# Patient Record
Sex: Male | Born: 1965 | Race: White | Hispanic: No | Marital: Single | Smoking: Current every day smoker
Health system: Southern US, Community
[De-identification: ages and names within clinical notes are randomized; demographics above are authoritative.]

## PROBLEM LIST (undated history)

## (undated) DIAGNOSIS — F411 Generalized anxiety disorder: Secondary | ICD-10-CM

## (undated) DIAGNOSIS — K219 Gastro-esophageal reflux disease without esophagitis: Secondary | ICD-10-CM

## (undated) DIAGNOSIS — F32A Depression, unspecified: Secondary | ICD-10-CM

## (undated) DIAGNOSIS — F4310 Post-traumatic stress disorder, unspecified: Secondary | ICD-10-CM

## (undated) DIAGNOSIS — J449 Chronic obstructive pulmonary disease, unspecified: Secondary | ICD-10-CM

## (undated) DIAGNOSIS — F1010 Alcohol abuse, uncomplicated: Secondary | ICD-10-CM

## (undated) DIAGNOSIS — F329 Major depressive disorder, single episode, unspecified: Secondary | ICD-10-CM

## (undated) DIAGNOSIS — I10 Essential (primary) hypertension: Secondary | ICD-10-CM

## (undated) DIAGNOSIS — T148XXA Other injury of unspecified body region, initial encounter: Secondary | ICD-10-CM

## (undated) DIAGNOSIS — F419 Anxiety disorder, unspecified: Secondary | ICD-10-CM

## (undated) HISTORY — PX: ABDOMINAL EXPLORATION SURGERY: SHX538

## (undated) HISTORY — PX: KNEE SURGERY: SHX244

---
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
## 2018-05-10 DIAGNOSIS — J449 Chronic obstructive pulmonary disease, unspecified: Secondary | ICD-10-CM | POA: Insufficient documentation

## 2018-05-10 DIAGNOSIS — F1020 Alcohol dependence, uncomplicated: Secondary | ICD-10-CM | POA: Insufficient documentation

## 2018-05-10 DIAGNOSIS — I10 Essential (primary) hypertension: Secondary | ICD-10-CM | POA: Insufficient documentation

## 2018-05-10 DIAGNOSIS — E039 Hypothyroidism, unspecified: Secondary | ICD-10-CM | POA: Insufficient documentation

## 2018-05-10 DIAGNOSIS — F419 Anxiety disorder, unspecified: Secondary | ICD-10-CM | POA: Insufficient documentation

## 2018-05-10 DIAGNOSIS — N401 Enlarged prostate with lower urinary tract symptoms: Secondary | ICD-10-CM | POA: Insufficient documentation

## 2018-05-10 DIAGNOSIS — K219 Gastro-esophageal reflux disease without esophagitis: Secondary | ICD-10-CM | POA: Insufficient documentation

## 2018-05-10 DIAGNOSIS — G894 Chronic pain syndrome: Secondary | ICD-10-CM | POA: Insufficient documentation

## 2018-05-10 DIAGNOSIS — F1910 Other psychoactive substance abuse, uncomplicated: Secondary | ICD-10-CM | POA: Insufficient documentation

## 2018-05-10 DIAGNOSIS — Z5900 Homelessness unspecified: Secondary | ICD-10-CM | POA: Insufficient documentation

## 2018-05-10 DIAGNOSIS — F331 Major depressive disorder, recurrent, moderate: Secondary | ICD-10-CM | POA: Insufficient documentation

## 2018-05-15 DIAGNOSIS — F1020 Alcohol dependence, uncomplicated: Secondary | ICD-10-CM | POA: Insufficient documentation

## 2019-04-19 ENCOUNTER — Emergency Department (HOSPITAL_BASED_OUTPATIENT_CLINIC_OR_DEPARTMENT_OTHER): Payer: Medicare Other

## 2019-04-19 ENCOUNTER — Emergency Department (HOSPITAL_BASED_OUTPATIENT_CLINIC_OR_DEPARTMENT_OTHER)
Admission: EM | Admit: 2019-04-19 | Discharge: 2019-04-19 | Disposition: A | Discharge status: Home / Self Care | Payer: Medicare Other | Attending: Emergency Medicine | Admitting: Emergency Medicine

## 2019-04-19 ENCOUNTER — Other Ambulatory Visit: Payer: Self-pay

## 2019-04-19 ENCOUNTER — Encounter (HOSPITAL_BASED_OUTPATIENT_CLINIC_OR_DEPARTMENT_OTHER): Payer: Self-pay | Admitting: *Deleted

## 2019-04-19 DIAGNOSIS — J449 Chronic obstructive pulmonary disease, unspecified: Secondary | ICD-10-CM | POA: Insufficient documentation

## 2019-04-19 DIAGNOSIS — F17200 Nicotine dependence, unspecified, uncomplicated: Secondary | ICD-10-CM | POA: Diagnosis not present

## 2019-04-19 DIAGNOSIS — S60212A Contusion of left wrist, initial encounter: Secondary | ICD-10-CM

## 2019-04-19 DIAGNOSIS — S6992XA Unspecified injury of left wrist, hand and finger(s), initial encounter: Secondary | ICD-10-CM | POA: Diagnosis present

## 2019-04-19 DIAGNOSIS — I10 Essential (primary) hypertension: Secondary | ICD-10-CM | POA: Insufficient documentation

## 2019-04-19 DIAGNOSIS — Y9389 Activity, other specified: Secondary | ICD-10-CM | POA: Diagnosis not present

## 2019-04-19 DIAGNOSIS — Z79899 Other long term (current) drug therapy: Secondary | ICD-10-CM | POA: Insufficient documentation

## 2019-04-19 DIAGNOSIS — Y92810 Car as the place of occurrence of the external cause: Secondary | ICD-10-CM | POA: Insufficient documentation

## 2019-04-19 DIAGNOSIS — Y998 Other external cause status: Secondary | ICD-10-CM | POA: Diagnosis not present

## 2019-04-19 HISTORY — DX: Post-traumatic stress disorder, unspecified: F43.10

## 2019-04-19 HISTORY — DX: Other injury of unspecified body region, initial encounter: T14.8XXA

## 2019-04-19 HISTORY — DX: Essential (primary) hypertension: I10

## 2019-04-19 HISTORY — DX: Anxiety disorder, unspecified: F41.9

## 2019-04-19 HISTORY — DX: Chronic obstructive pulmonary disease, unspecified: J44.9

## 2019-04-19 HISTORY — DX: Depression, unspecified: F32.A

## 2019-04-19 MED ORDER — AMOXICILLIN-POT CLAVULANATE 875-125 MG PO TABS: 1.0000 | ORAL_TABLET | Freq: Two times a day (BID) | ORAL | 0 refills | Status: DC

## 2019-04-19 MED ORDER — AMOXICILLIN-POT CLAVULANATE 875-125 MG PO TABS
1.0000 | ORAL_TABLET | Freq: Once | ORAL | Status: AC
  Administered 2019-04-19: 1 via ORAL
  Filled 2019-04-19: qty 1

## 2019-04-19 MED ORDER — IBUPROFEN 800 MG PO TABS
800.0000 mg | ORAL_TABLET | Freq: Once | ORAL | Status: AC
  Administered 2019-04-19: 22:00:00 800 mg via ORAL
  Filled 2019-04-19: qty 1

## 2019-04-19 MED ORDER — LIDOCAINE HCL (PF) 1 % IJ SOLN
5.0000 mL | Freq: Once | INTRAMUSCULAR | Status: AC
  Administered 2019-04-19: 22:00:00 5 mL via INTRADERMAL
  Filled 2019-04-19: qty 5

## 2019-04-19 NOTE — ED Notes (Signed)
MD at bedside, suturing patient at this time.

## 2019-04-19 NOTE — Discharge Instructions (Signed)
Take augmentin twice daily for a week to prevent infection   Take tylenol, motrin for pain   See your doctor  You may remove the dressing tomorrow. The hematoma may return but there are no glass in the wound   Return to ER if you have worse wrist pain or swelling, fever, purulent discharge from wound
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
## 2019-08-08 DIAGNOSIS — F1120 Opioid dependence, uncomplicated: Secondary | ICD-10-CM | POA: Insufficient documentation

## 2020-04-29 ENCOUNTER — Emergency Department (HOSPITAL_COMMUNITY): Payer: Medicare Other

## 2020-04-29 ENCOUNTER — Other Ambulatory Visit: Payer: Self-pay

## 2020-04-29 ENCOUNTER — Emergency Department (HOSPITAL_COMMUNITY)
Admission: EM | Admit: 2020-04-29 | Discharge: 2020-04-30 | Disposition: A | Discharge status: Home / Self Care | Payer: Medicare Other | Attending: Emergency Medicine | Admitting: Emergency Medicine

## 2020-04-29 DIAGNOSIS — E860 Dehydration: Secondary | ICD-10-CM | POA: Diagnosis not present

## 2020-04-29 DIAGNOSIS — I10 Essential (primary) hypertension: Secondary | ICD-10-CM | POA: Diagnosis not present

## 2020-04-29 DIAGNOSIS — J449 Chronic obstructive pulmonary disease, unspecified: Secondary | ICD-10-CM | POA: Diagnosis not present

## 2020-04-29 DIAGNOSIS — F17200 Nicotine dependence, unspecified, uncomplicated: Secondary | ICD-10-CM | POA: Insufficient documentation

## 2020-04-29 DIAGNOSIS — Z79899 Other long term (current) drug therapy: Secondary | ICD-10-CM | POA: Diagnosis not present

## 2020-04-29 DIAGNOSIS — R112 Nausea with vomiting, unspecified: Secondary | ICD-10-CM | POA: Diagnosis not present

## 2020-04-29 DIAGNOSIS — R109 Unspecified abdominal pain: Secondary | ICD-10-CM | POA: Insufficient documentation

## 2020-04-29 MED ORDER — LORAZEPAM 2 MG/ML IJ SOLN
0.0000 mg | Freq: Four times a day (QID) | INTRAMUSCULAR | Status: DC
  Filled 2020-04-29: qty 1

## 2020-04-29 MED ORDER — THIAMINE HCL 100 MG PO TABS: 100.0000 mg | ORAL_TABLET | Freq: Every day | ORAL | Status: DC

## 2020-04-29 MED ORDER — SODIUM CHLORIDE 0.9 % IV BOLUS
1000.0000 mL | Freq: Once | INTRAVENOUS | Status: AC
  Administered 2020-04-30: 1000 mL via INTRAVENOUS

## 2020-04-29 MED ORDER — METOCLOPRAMIDE HCL 5 MG/ML IJ SOLN
10.0000 mg | Freq: Once | INTRAMUSCULAR | Status: AC
  Administered 2020-04-30: 10 mg via INTRAVENOUS
  Filled 2020-04-29: qty 2

## 2020-04-29 MED ORDER — LORAZEPAM 2 MG/ML IJ SOLN: 0.0000 mg | Freq: Two times a day (BID) | INTRAMUSCULAR | Status: DC

## 2020-04-29 MED ORDER — LORAZEPAM 1 MG PO TABS: 0.0000 mg | ORAL_TABLET | Freq: Two times a day (BID) | ORAL | Status: DC

## 2020-04-29 MED ORDER — DIPHENHYDRAMINE HCL 50 MG/ML IJ SOLN
25.0000 mg | Freq: Once | INTRAMUSCULAR | Status: AC
  Administered 2020-04-30: 25 mg via INTRAVENOUS
  Filled 2020-04-29: qty 1

## 2020-04-29 MED ORDER — THIAMINE HCL 100 MG/ML IJ SOLN
100.0000 mg | Freq: Every day | INTRAMUSCULAR | Status: DC
  Filled 2020-04-29: qty 2

## 2020-04-29 MED ORDER — LORAZEPAM 1 MG PO TABS: 0.0000 mg | ORAL_TABLET | Freq: Four times a day (QID) | ORAL | Status: DC

## 2020-04-29 NOTE — ED Triage Notes (Signed)
Pt arrived [facility] Was vomiting in room Possible detox Drank vodka earlier today  Complaining of abdominal pain
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
## 2020-04-30 LAB — COMPREHENSIVE METABOLIC PANEL
ALT: 21 U/L (ref 0–44)
AST: 22 U/L (ref 15–41)
Albumin: 4.9 g/dL (ref 3.5–5.0)
Alkaline Phosphatase: 84 U/L (ref 38–126)
Anion gap: 12 (ref 5–15)
BUN: 12 mg/dL (ref 6–20)
CO2: 24 mmol/L (ref 22–32)
Calcium: 9.3 mg/dL (ref 8.9–10.3)
Chloride: 100 mmol/L (ref 98–111)
Creatinine, Ser: 0.77 mg/dL (ref 0.61–1.24)
GFR calc Af Amer: >60 mL/min (ref >60)
GFR calc non Af Amer: >60 mL/min (ref >60)
Glucose, Bld: 122 mg/dL — ABNORMAL HIGH (ref 70–99)
Potassium: 3.6 mmol/L (ref 3.5–5.1)
Sodium: 136 mmol/L (ref 135–145)
Total Bilirubin: 0.9 mg/dL (ref 0.3–1.2)
Total Protein: 8.1 g/dL (ref 6.5–8.1)

## 2020-04-30 LAB — CBC
HCT: 41.3 % (ref 39.0–52.0)
Hemoglobin: 14.5 g/dL (ref 13.0–17.0)
MCH: 30.2 pg (ref 26.0–34.0)
MCHC: 35.1 g/dL (ref 30.0–36.0)
MCV: 86 fL (ref 80.0–100.0)
Platelets: 325 10*3/uL (ref 150–400)
RBC: 4.8 MIL/uL (ref 4.22–5.81)
RDW: 13.2 % (ref 11.5–15.5)
WBC: 9.2 10*3/uL (ref 4.0–10.5)
nRBC: 0 % (ref 0.0–0.2)

## 2020-04-30 LAB — ETHANOL: Alcohol, Ethyl (B): <10 mg/dL (ref <10)

## 2020-04-30 LAB — LACTIC ACID, PLASMA: Lactic Acid, Venous: 1.1 mmol/L (ref 0.5–1.9)

## 2020-04-30 LAB — LIPASE, BLOOD: Lipase: 37 U/L (ref 11–51)

## 2020-04-30 MED ORDER — ONDANSETRON HCL 4 MG PO TABS
4.0000 mg | ORAL_TABLET | Freq: Three times a day (TID) | ORAL | 0 refills | Status: DC | PRN
Start: 2020-04-30 — End: 2021-03-09

## 2020-04-30 NOTE — ED Notes (Signed)
Patient given 8oz of apple juice, tolerated without issue.

## 2020-04-30 NOTE — Discharge Instructions (Addendum)
Drink plenty of fluids (clear liquids) then start a bland diet later this morning such as toast, crackers, jello, [employer] chicken noodle soup. Use the zofran for nausea or vomiting. Recheck if you get worse.   

## 2020-06-17 DIAGNOSIS — F1520 Other stimulant dependence, uncomplicated: Secondary | ICD-10-CM | POA: Insufficient documentation

## 2020-06-17 DIAGNOSIS — F1220 Cannabis dependence, uncomplicated: Secondary | ICD-10-CM | POA: Insufficient documentation

## 2020-10-18 DIAGNOSIS — Z5181 Encounter for therapeutic drug level monitoring: Secondary | ICD-10-CM | POA: Insufficient documentation

## 2020-10-18 DIAGNOSIS — Z79891 Long term (current) use of opiate analgesic: Secondary | ICD-10-CM | POA: Insufficient documentation

## 2020-10-18 DIAGNOSIS — F1420 Cocaine dependence, uncomplicated: Secondary | ICD-10-CM | POA: Insufficient documentation

## 2021-03-06 ENCOUNTER — Other Ambulatory Visit: Payer: Self-pay

## 2021-03-06 ENCOUNTER — Emergency Department (HOSPITAL_COMMUNITY)
Admission: EM | Admit: 2021-03-06 | Discharge: 2021-03-07 | Disposition: A | Discharge status: Home / Self Care | Payer: Medicare HMO | Attending: Emergency Medicine | Admitting: Emergency Medicine

## 2021-03-06 DIAGNOSIS — S60812A Abrasion of left wrist, initial encounter: Secondary | ICD-10-CM | POA: Diagnosis not present

## 2021-03-06 DIAGNOSIS — I10 Essential (primary) hypertension: Secondary | ICD-10-CM | POA: Insufficient documentation

## 2021-03-06 DIAGNOSIS — S50311A Abrasion of right elbow, initial encounter: Secondary | ICD-10-CM | POA: Insufficient documentation

## 2021-03-06 DIAGNOSIS — F17200 Nicotine dependence, unspecified, uncomplicated: Secondary | ICD-10-CM | POA: Diagnosis not present

## 2021-03-06 DIAGNOSIS — S42401A Unspecified fracture of lower end of right humerus, initial encounter for closed fracture: Secondary | ICD-10-CM | POA: Insufficient documentation

## 2021-03-06 DIAGNOSIS — S42494A Other nondisplaced fracture of lower end of right humerus, initial encounter for closed fracture: Secondary | ICD-10-CM

## 2021-03-06 DIAGNOSIS — J449 Chronic obstructive pulmonary disease, unspecified: Secondary | ICD-10-CM | POA: Diagnosis not present

## 2021-03-06 DIAGNOSIS — Z23 Encounter for immunization: Secondary | ICD-10-CM | POA: Insufficient documentation

## 2021-03-06 DIAGNOSIS — Z79899 Other long term (current) drug therapy: Secondary | ICD-10-CM | POA: Insufficient documentation

## 2021-03-06 DIAGNOSIS — S4991XA Unspecified injury of right shoulder and upper arm, initial encounter: Secondary | ICD-10-CM | POA: Diagnosis present

## 2021-03-06 DIAGNOSIS — T07XXXA Unspecified multiple injuries, initial encounter: Secondary | ICD-10-CM
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
## 2021-03-07 ENCOUNTER — Emergency Department (HOSPITAL_COMMUNITY): Payer: Medicare HMO

## 2021-03-07 MED ORDER — HYDROCODONE-ACETAMINOPHEN 5-325 MG PO TABS
1.0000 | ORAL_TABLET | Freq: Once | ORAL | Status: AC
  Administered 2021-03-07: 1 via ORAL
  Filled 2021-03-07: qty 1

## 2021-03-07 MED ORDER — TETANUS-DIPHTH-ACELL PERTUSSIS 5-2.5-18.5 LF-MCG/0.5 IM SUSY
0.5000 mL | PREFILLED_SYRINGE | Freq: Once | INTRAMUSCULAR | Status: AC
  Administered 2021-03-07: 0.5 mL via INTRAMUSCULAR
  Filled 2021-03-07: qty 0.5

## 2021-03-07 NOTE — ED Notes (Signed)
Pt ambulated to x-ray.

## 2021-03-07 NOTE — Progress Notes (Signed)
Orthopedic Tech Progress Note Patient Details:  Andrew Wheeler 1966/05/26 [id number]  Ortho Devices Type of Ortho Device: Arm sling, Post (long arm) splint Ortho Device/Splint Location: rue Ortho Device/Splint Interventions: Ordered, Application, Adjustment   Post Interventions Patient Tolerated: Well Instructions Provided: Care of device, Adjustment of device  [name] 03/07/2021, 5:00 AM
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
## 2021-03-07 NOTE — ED Notes (Signed)
Ortho tech called 

## 2021-03-09 ENCOUNTER — Other Ambulatory Visit: Payer: Self-pay | Admitting: Critical Care Medicine

## 2021-03-09 ENCOUNTER — Encounter: Payer: Self-pay | Admitting: Critical Care Medicine

## 2021-03-09 DIAGNOSIS — S42401K Unspecified fracture of lower end of right humerus, subsequent encounter for fracture with nonunion: Secondary | ICD-10-CM

## 2021-03-09 MED ORDER — ADVAIR HFA 115-21 MCG/ACT IN AERO: 2.0000 | INHALATION_SPRAY | Freq: Two times a day (BID) | RESPIRATORY_TRACT | 12 refills | Status: DC

## 2021-03-09 MED ORDER — OMEPRAZOLE 40 MG PO CPDR: 40.0000 mg | DELAYED_RELEASE_CAPSULE | Freq: Every day | ORAL | 1 refills | Status: AC

## 2021-03-09 MED ORDER — HYDROCORTISONE 0.5 % EX CREA: 1.0000 "application " | TOPICAL_CREAM | Freq: Two times a day (BID) | CUTANEOUS | 0 refills | Status: DC

## 2021-03-09 MED ORDER — CLONIDINE HCL 0.1 MG PO TABS: 0.1000 mg | ORAL_TABLET | Freq: Two times a day (BID) | ORAL | 1 refills | Status: DC

## 2021-03-09 MED ORDER — HYDROXYZINE PAMOATE 25 MG PO CAPS
25.0000 mg | ORAL_CAPSULE | Freq: Three times a day (TID) | ORAL | 0 refills | Status: DC | PRN
Start: 2021-03-09 — End: 2021-03-25

## 2021-03-09 MED ORDER — ALBUTEROL SULFATE HFA 108 (90 BASE) MCG/ACT IN AERS: 2.0000 | INHALATION_SPRAY | Freq: Four times a day (QID) | RESPIRATORY_TRACT | 0 refills | Status: DC | PRN

## 2021-03-09 MED ORDER — PREGABALIN 150 MG PO CAPS: 150.0000 mg | ORAL_CAPSULE | Freq: Two times a day (BID) | ORAL | 1 refills | Status: DC

## 2021-03-09 MED ORDER — BUPROPION HCL ER (XL) 300 MG PO TB24: 300.0000 mg | ORAL_TABLET | Freq: Every day | ORAL | 1 refills | Status: DC
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
## 2021-03-14 ENCOUNTER — Emergency Department (HOSPITAL_COMMUNITY): Payer: Medicare HMO

## 2021-03-14 ENCOUNTER — Inpatient Hospital Stay (HOSPITAL_COMMUNITY)
Admission: EM | Admit: 2021-03-14 | Discharge: 2021-03-25 | DRG: 917 | Disposition: A | Discharge status: Home / Self Care | Payer: Medicare HMO | Attending: Internal Medicine | Admitting: Internal Medicine

## 2021-03-14 DIAGNOSIS — R0602 Shortness of breath: Secondary | ICD-10-CM

## 2021-03-14 DIAGNOSIS — Z9119 Patient's noncompliance with other medical treatment and regimen: Secondary | ICD-10-CM

## 2021-03-14 DIAGNOSIS — Y92009 Unspecified place in unspecified non-institutional (private) residence as the place of occurrence of the external cause: Secondary | ICD-10-CM

## 2021-03-14 DIAGNOSIS — I469 Cardiac arrest, cause unspecified: Secondary | ICD-10-CM | POA: Diagnosis not present

## 2021-03-14 DIAGNOSIS — G894 Chronic pain syndrome: Secondary | ICD-10-CM | POA: Diagnosis present

## 2021-03-14 DIAGNOSIS — T405X1A Poisoning by cocaine, accidental (unintentional), initial encounter: Secondary | ICD-10-CM | POA: Diagnosis not present

## 2021-03-14 DIAGNOSIS — R571 Hypovolemic shock: Secondary | ICD-10-CM | POA: Diagnosis present

## 2021-03-14 DIAGNOSIS — F10239 Alcohol dependence with withdrawal, unspecified: Secondary | ICD-10-CM | POA: Diagnosis present

## 2021-03-14 DIAGNOSIS — F1910 Other psychoactive substance abuse, uncomplicated: Secondary | ICD-10-CM | POA: Diagnosis present

## 2021-03-14 DIAGNOSIS — Z5901 Sheltered homelessness: Secondary | ICD-10-CM

## 2021-03-14 DIAGNOSIS — I10 Essential (primary) hypertension: Secondary | ICD-10-CM | POA: Diagnosis present

## 2021-03-14 DIAGNOSIS — T424X1A Poisoning by benzodiazepines, accidental (unintentional), initial encounter: Secondary | ICD-10-CM | POA: Diagnosis present

## 2021-03-14 DIAGNOSIS — S42431A Displaced fracture (avulsion) of lateral epicondyle of right humerus, initial encounter for closed fracture: Secondary | ICD-10-CM | POA: Diagnosis present

## 2021-03-14 DIAGNOSIS — Z20822 Contact with and (suspected) exposure to covid-19: Secondary | ICD-10-CM | POA: Diagnosis present

## 2021-03-14 DIAGNOSIS — F1123 Opioid dependence with withdrawal: Secondary | ICD-10-CM | POA: Diagnosis present

## 2021-03-14 DIAGNOSIS — J9601 Acute respiratory failure with hypoxia: Secondary | ICD-10-CM | POA: Diagnosis present

## 2021-03-14 DIAGNOSIS — G9340 Encephalopathy, unspecified: Secondary | ICD-10-CM | POA: Diagnosis present

## 2021-03-14 DIAGNOSIS — J449 Chronic obstructive pulmonary disease, unspecified: Secondary | ICD-10-CM | POA: Diagnosis present

## 2021-03-14 DIAGNOSIS — G928 Other toxic encephalopathy: Secondary | ICD-10-CM | POA: Diagnosis present

## 2021-03-14 DIAGNOSIS — Y906 Blood alcohol level of 120-199 mg/100 ml: Secondary | ICD-10-CM | POA: Diagnosis present

## 2021-03-14 DIAGNOSIS — T50901A Poisoning by unspecified drugs, medicaments and biological substances, accidental (unintentional), initial encounter: Secondary | ICD-10-CM | POA: Diagnosis present

## 2021-03-14 DIAGNOSIS — F17200 Nicotine dependence, unspecified, uncomplicated: Secondary | ICD-10-CM | POA: Diagnosis present

## 2021-03-14 DIAGNOSIS — F1020 Alcohol dependence, uncomplicated: Secondary | ICD-10-CM | POA: Diagnosis present

## 2021-03-14 DIAGNOSIS — I468 Cardiac arrest due to other underlying condition: Secondary | ICD-10-CM | POA: Diagnosis present

## 2021-03-14 DIAGNOSIS — L309 Dermatitis, unspecified: Secondary | ICD-10-CM | POA: Diagnosis present

## 2021-03-14 DIAGNOSIS — T510X1A Toxic effect of ethanol, accidental (unintentional), initial encounter: Secondary | ICD-10-CM | POA: Diagnosis present

## 2021-03-14 DIAGNOSIS — R0781 Pleurodynia: Secondary | ICD-10-CM

## 2021-03-14 DIAGNOSIS — E039 Hypothyroidism, unspecified: Secondary | ICD-10-CM | POA: Diagnosis present

## 2021-03-14 DIAGNOSIS — R1310 Dysphagia, unspecified: Secondary | ICD-10-CM

## 2021-03-14 DIAGNOSIS — J4489 Other specified chronic obstructive pulmonary disease: Secondary | ICD-10-CM | POA: Diagnosis present

## 2021-03-14 DIAGNOSIS — S2241XA Multiple fractures of ribs, right side, initial encounter for closed fracture: Secondary | ICD-10-CM | POA: Diagnosis present

## 2021-03-14 DIAGNOSIS — E876 Hypokalemia: Secondary | ICD-10-CM | POA: Diagnosis not present

## 2021-03-14 DIAGNOSIS — F1420 Cocaine dependence, uncomplicated: Secondary | ICD-10-CM | POA: Diagnosis present

## 2021-03-14 DIAGNOSIS — I959 Hypotension, unspecified: Secondary | ICD-10-CM

## 2021-03-14 DIAGNOSIS — E872 Acidosis: Secondary | ICD-10-CM | POA: Diagnosis present

## 2021-03-14 DIAGNOSIS — R069 Unspecified abnormalities of breathing: Secondary | ICD-10-CM

## 2021-03-14 LAB — I-STAT ARTERIAL BLOOD GAS, ED
Acid-base deficit: 12 mmol/L — ABNORMAL HIGH (ref 0.0–2.0)
Bicarbonate: 17.1 mmol/L — ABNORMAL LOW (ref 20.0–28.0)
Calcium, Ion: 1.1 mmol/L — ABNORMAL LOW (ref 1.15–1.40)
HCT: 35 % — ABNORMAL LOW (ref 39.0–52.0)
Hemoglobin: 11.9 g/dL — ABNORMAL LOW (ref 13.0–17.0)
O2 Saturation: 100 %
Patient temperature: 96.2
Potassium: 5.1 mmol/L (ref 3.5–5.1)
Sodium: 140 mmol/L (ref 135–145)
TCO2: 19 mmol/L — ABNORMAL LOW (ref 22–32)
pCO2 arterial: 46.1 mmHg (ref 32.0–48.0)
pH, Arterial: 7.169 — CL (ref 7.350–7.450)
pO2, Arterial: 318 mmHg — ABNORMAL HIGH (ref 83.0–108.0)

## 2021-03-14 MED ORDER — FENTANYL BOLUS VIA INFUSION
50.0000 ug | INTRAVENOUS | Status: DC | PRN
  Filled 2021-03-14: qty 50

## 2021-03-14 MED ORDER — NOREPINEPHRINE 4 MG/250ML-% IV SOLN
INTRAVENOUS | Status: AC
  Administered 2021-03-14: 10 ug/min via INTRAVENOUS
  Filled 2021-03-14: qty 250

## 2021-03-14 MED ORDER — MIDAZOLAM HCL 2 MG/2ML IJ SOLN
2.0000 mg | INTRAMUSCULAR | Status: AC | PRN
  Administered 2021-03-15 (×2): 2 mg via INTRAVENOUS
  Filled 2021-03-14 (×3): qty 2

## 2021-03-14 MED ORDER — FENTANYL 2500MCG IN NS 250ML (10MCG/ML) PREMIX INFUSION
25.0000 ug/h | INTRAVENOUS | Status: DC
  Administered 2021-03-14: 50 ug/h via INTRAVENOUS
  Filled 2021-03-14: qty 250

## 2021-03-14 MED ORDER — SODIUM CHLORIDE 0.9 % IV SOLN
INTRAVENOUS | Status: AC | PRN
  Administered 2021-03-14: 1000 mL via INTRAVENOUS

## 2021-03-14 MED ORDER — FENTANYL CITRATE (PF) 100 MCG/2ML IJ SOLN: 50.0000 ug | Freq: Once | INTRAMUSCULAR | Status: DC

## 2021-03-14 MED ORDER — PROPOFOL 1000 MG/100ML IV EMUL
INTRAVENOUS | Status: AC
  Administered 2021-03-15: 5 ug/kg/min via INTRAVENOUS
  Filled 2021-03-14: qty 100

## 2021-03-14 MED ORDER — SUCCINYLCHOLINE CHLORIDE 20 MG/ML IJ SOLN
INTRAMUSCULAR | Status: AC | PRN
  Administered 2021-03-14: 150 mg via INTRAVENOUS

## 2021-03-14 MED ORDER — MIDAZOLAM HCL 2 MG/2ML IJ SOLN
INTRAMUSCULAR | Status: AC
  Administered 2021-03-14: 2 mg via INTRAVENOUS
  Filled 2021-03-14: qty 6

## 2021-03-14 MED ORDER — NOREPINEPHRINE 4 MG/250ML-% IV SOLN
0.0000 ug/min | INTRAVENOUS | Status: DC
  Filled 2021-03-14: qty 250

## 2021-03-14 MED ORDER — ETOMIDATE 2 MG/ML IV SOLN
INTRAVENOUS | Status: AC | PRN
  Administered 2021-03-14: 10 mg via INTRAVENOUS

## 2021-03-14 MED ORDER — MIDAZOLAM HCL 2 MG/2ML IJ SOLN
2.0000 mg | INTRAMUSCULAR | Status: DC | PRN
  Administered 2021-03-14 – 2021-03-15 (×5): 2 mg via INTRAVENOUS
  Filled 2021-03-14 (×3): qty 2

## 2021-03-14 MED ORDER — FENTANYL CITRATE (PF) 100 MCG/2ML IJ SOLN
INTRAMUSCULAR | Status: AC
  Filled 2021-03-14: qty 2
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
## 2021-03-14 NOTE — ED Notes (Signed)
bair hugger applied.
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
## 2021-03-15 ENCOUNTER — Emergency Department (HOSPITAL_COMMUNITY): Payer: Medicare HMO

## 2021-03-15 ENCOUNTER — Inpatient Hospital Stay (HOSPITAL_COMMUNITY): Payer: Medicare HMO

## 2021-03-15 DIAGNOSIS — J449 Chronic obstructive pulmonary disease, unspecified: Secondary | ICD-10-CM | POA: Diagnosis present

## 2021-03-15 DIAGNOSIS — I10 Essential (primary) hypertension: Secondary | ICD-10-CM | POA: Diagnosis present

## 2021-03-15 DIAGNOSIS — J9601 Acute respiratory failure with hypoxia: Secondary | ICD-10-CM | POA: Diagnosis present

## 2021-03-15 DIAGNOSIS — Z9119 Patient's noncompliance with other medical treatment and regimen: Secondary | ICD-10-CM | POA: Diagnosis not present

## 2021-03-15 DIAGNOSIS — F10239 Alcohol dependence with withdrawal, unspecified: Secondary | ICD-10-CM | POA: Diagnosis present

## 2021-03-15 DIAGNOSIS — L309 Dermatitis, unspecified: Secondary | ICD-10-CM | POA: Diagnosis present

## 2021-03-15 DIAGNOSIS — T424X1A Poisoning by benzodiazepines, accidental (unintentional), initial encounter: Secondary | ICD-10-CM | POA: Diagnosis present

## 2021-03-15 DIAGNOSIS — F17200 Nicotine dependence, unspecified, uncomplicated: Secondary | ICD-10-CM | POA: Diagnosis present

## 2021-03-15 DIAGNOSIS — G894 Chronic pain syndrome: Secondary | ICD-10-CM | POA: Diagnosis present

## 2021-03-15 DIAGNOSIS — Y906 Blood alcohol level of 120-199 mg/100 ml: Secondary | ICD-10-CM | POA: Diagnosis present

## 2021-03-15 DIAGNOSIS — I468 Cardiac arrest due to other underlying condition: Secondary | ICD-10-CM | POA: Diagnosis present

## 2021-03-15 DIAGNOSIS — F1123 Opioid dependence with withdrawal: Secondary | ICD-10-CM | POA: Diagnosis present

## 2021-03-15 DIAGNOSIS — S2241XA Multiple fractures of ribs, right side, initial encounter for closed fracture: Secondary | ICD-10-CM | POA: Diagnosis present

## 2021-03-15 DIAGNOSIS — S42431A Displaced fracture (avulsion) of lateral epicondyle of right humerus, initial encounter for closed fracture: Secondary | ICD-10-CM | POA: Diagnosis present

## 2021-03-15 DIAGNOSIS — R571 Hypovolemic shock: Secondary | ICD-10-CM

## 2021-03-15 DIAGNOSIS — G928 Other toxic encephalopathy: Secondary | ICD-10-CM | POA: Diagnosis present

## 2021-03-15 DIAGNOSIS — F419 Anxiety disorder, unspecified: Secondary | ICD-10-CM | POA: Diagnosis not present

## 2021-03-15 DIAGNOSIS — E039 Hypothyroidism, unspecified: Secondary | ICD-10-CM | POA: Diagnosis present

## 2021-03-15 DIAGNOSIS — Z20822 Contact with and (suspected) exposure to covid-19: Secondary | ICD-10-CM | POA: Diagnosis present

## 2021-03-15 DIAGNOSIS — I469 Cardiac arrest, cause unspecified: Secondary | ICD-10-CM | POA: Diagnosis present

## 2021-03-15 DIAGNOSIS — Z5901 Sheltered homelessness: Secondary | ICD-10-CM | POA: Diagnosis not present

## 2021-03-15 DIAGNOSIS — F1910 Other psychoactive substance abuse, uncomplicated: Secondary | ICD-10-CM | POA: Diagnosis not present

## 2021-03-15 DIAGNOSIS — E872 Acidosis: Secondary | ICD-10-CM | POA: Diagnosis present

## 2021-03-15 DIAGNOSIS — I959 Hypotension, unspecified: Secondary | ICD-10-CM | POA: Insufficient documentation

## 2021-03-15 DIAGNOSIS — G9340 Encephalopathy, unspecified: Secondary | ICD-10-CM | POA: Diagnosis present

## 2021-03-15 DIAGNOSIS — T510X1A Toxic effect of ethanol, accidental (unintentional), initial encounter: Secondary | ICD-10-CM | POA: Diagnosis present

## 2021-03-15 DIAGNOSIS — F319 Bipolar disorder, unspecified: Secondary | ICD-10-CM | POA: Diagnosis not present

## 2021-03-15 DIAGNOSIS — Y92009 Unspecified place in unspecified non-institutional (private) residence as the place of occurrence of the external cause: Secondary | ICD-10-CM | POA: Diagnosis not present

## 2021-03-15 DIAGNOSIS — F1420 Cocaine dependence, uncomplicated: Secondary | ICD-10-CM | POA: Diagnosis present

## 2021-03-15 DIAGNOSIS — E876 Hypokalemia: Secondary | ICD-10-CM | POA: Diagnosis not present

## 2021-03-15 DIAGNOSIS — T405X1A Poisoning by cocaine, accidental (unintentional), initial encounter: Secondary | ICD-10-CM | POA: Diagnosis present

## 2021-03-15 LAB — I-STAT ARTERIAL BLOOD GAS, ED
Acid-base deficit: 6 mmol/L — ABNORMAL HIGH (ref 0.0–2.0)
Bicarbonate: 19.9 mmol/L — ABNORMAL LOW (ref 20.0–28.0)
Calcium, Ion: 1.11 mmol/L — ABNORMAL LOW (ref 1.15–1.40)
HCT: 33 % — ABNORMAL LOW (ref 39.0–52.0)
Hemoglobin: 11.2 g/dL — ABNORMAL LOW (ref 13.0–17.0)
O2 Saturation: 96 %
Patient temperature: 98.5
Potassium: 4.2 mmol/L (ref 3.5–5.1)
Sodium: 143 mmol/L (ref 135–145)
TCO2: 21 mmol/L — ABNORMAL LOW (ref 22–32)
pCO2 arterial: 40.8 mmHg (ref 32.0–48.0)
pH, Arterial: 7.296 — ABNORMAL LOW (ref 7.350–7.450)
pO2, Arterial: 87 mmHg (ref 83.0–108.0)

## 2021-03-15 LAB — CBC WITH DIFFERENTIAL/PLATELET
Abs Immature Granulocytes: 0.39 10*3/uL — ABNORMAL HIGH (ref 0.00–0.07)
Basophils Absolute: 0 10*3/uL (ref 0.0–0.1)
Basophils Relative: 0 %
Eosinophils Absolute: 0 10*3/uL (ref 0.0–0.5)
Eosinophils Relative: 0 %
HCT: 33.4 % — ABNORMAL LOW (ref 39.0–52.0)
Hemoglobin: 10.7 g/dL — ABNORMAL LOW (ref 13.0–17.0)
Immature Granulocytes: 3 %
Lymphocytes Relative: 4 %
Lymphs Abs: 0.5 10*3/uL — ABNORMAL LOW (ref 0.7–4.0)
MCH: 31.4 pg (ref 26.0–34.0)
MCHC: 32 g/dL (ref 30.0–36.0)
MCV: 97.9 fL (ref 80.0–100.0)
Monocytes Absolute: 0.7 10*3/uL (ref 0.1–1.0)
Monocytes Relative: 5 %
Neutro Abs: 13 10*3/uL — ABNORMAL HIGH (ref 1.7–7.7)
Neutrophils Relative %: 88 %
Platelets: 159 10*3/uL (ref 150–400)
RBC: 3.41 MIL/uL — ABNORMAL LOW (ref 4.22–5.81)
RDW: 14.3 % (ref 11.5–15.5)
WBC: 14.7 10*3/uL — ABNORMAL HIGH (ref 4.0–10.5)
nRBC: 0 % (ref 0.0–0.2)

## 2021-03-15 LAB — CBC
HCT: 33.4 % — ABNORMAL LOW (ref 39.0–52.0)
HCT: 37.3 % — ABNORMAL LOW (ref 39.0–52.0)
Hemoglobin: 10.9 g/dL — ABNORMAL LOW (ref 13.0–17.0)
Hemoglobin: 12.4 g/dL — ABNORMAL LOW (ref 13.0–17.0)
MCH: 31.1 pg (ref 26.0–34.0)
MCH: 31.3 pg (ref 26.0–34.0)
MCHC: 32.6 g/dL (ref 30.0–36.0)
MCHC: 33.2 g/dL (ref 30.0–36.0)
MCV: 95.4 fL (ref 80.0–100.0)
MCV: 94.2 fL (ref 80.0–100.0)
Platelets: 147 10*3/uL — ABNORMAL LOW (ref 150–400)
Platelets: 143 10*3/uL — ABNORMAL LOW (ref 150–400)
RBC: 3.5 MIL/uL — ABNORMAL LOW (ref 4.22–5.81)
RBC: 3.96 MIL/uL — ABNORMAL LOW (ref 4.22–5.81)
RDW: 14.2 % (ref 11.5–15.5)
RDW: 14.2 % (ref 11.5–15.5)
WBC: 10.8 10*3/uL — ABNORMAL HIGH (ref 4.0–10.5)
WBC: 8 10*3/uL (ref 4.0–10.5)
nRBC: 0 % (ref 0.0–0.2)
nRBC: 0 % (ref 0.0–0.2)

## 2021-03-15 LAB — URINALYSIS, ROUTINE W REFLEX MICROSCOPIC
Bilirubin Urine: NEGATIVE
Glucose, UA: >=500 mg/dL — AB
Ketones, ur: NEGATIVE mg/dL
Leukocytes,Ua: NEGATIVE
Nitrite: NEGATIVE
Protein, ur: 100 mg/dL — AB
Specific Gravity, Urine: 1.015 (ref 1.005–1.030)
pH: 5 (ref 5.0–8.0)

## 2021-03-15 LAB — COMPREHENSIVE METABOLIC PANEL
ALT: 130 U/L — ABNORMAL HIGH (ref 0–44)
ALT: 107 U/L — ABNORMAL HIGH (ref 0–44)
AST: 301 U/L — ABNORMAL HIGH (ref 15–41)
AST: 120 U/L — ABNORMAL HIGH (ref 15–41)
Albumin: 2.9 g/dL — ABNORMAL LOW (ref 3.5–5.0)
Albumin: 3.2 g/dL — ABNORMAL LOW (ref 3.5–5.0)
Alkaline Phosphatase: 70 U/L (ref 38–126)
Alkaline Phosphatase: 72 U/L (ref 38–126)
Anion gap: 17 — ABNORMAL HIGH (ref 5–15)
Anion gap: 8 (ref 5–15)
BUN: 16 mg/dL (ref 6–20)
BUN: 14 mg/dL (ref 6–20)
CO2: 15 mmol/L — ABNORMAL LOW (ref 22–32)
CO2: 23 mmol/L (ref 22–32)
Calcium: 7.3 mg/dL — ABNORMAL LOW (ref 8.9–10.3)
Calcium: 8.1 mg/dL — ABNORMAL LOW (ref 8.9–10.3)
Chloride: 108 mmol/L (ref 98–111)
Chloride: 104 mmol/L (ref 98–111)
Creatinine, Ser: 0.87 mg/dL (ref 0.61–1.24)
Creatinine, Ser: 0.65 mg/dL (ref 0.61–1.24)
GFR, Estimated: >60 mL/min (ref >60)
GFR, Estimated: >60 mL/min (ref >60)
Glucose, Bld: 225 mg/dL — ABNORMAL HIGH (ref 70–99)
Glucose, Bld: 106 mg/dL — ABNORMAL HIGH (ref 70–99)
Potassium: 4.5 mmol/L (ref 3.5–5.1)
Potassium: 4 mmol/L (ref 3.5–5.1)
Sodium: 140 mmol/L (ref 135–145)
Sodium: 135 mmol/L (ref 135–145)
Total Bilirubin: 0.6 mg/dL (ref 0.3–1.2)
Total Bilirubin: 0.9 mg/dL (ref 0.3–1.2)
Total Protein: 5 g/dL — ABNORMAL LOW (ref 6.5–8.1)
Total Protein: 5.4 g/dL — ABNORMAL LOW (ref 6.5–8.1)

## 2021-03-15 LAB — POCT I-STAT 7, (LYTES, BLD GAS, ICA,H+H)
Acid-Base Excess: 1 mmol/L (ref 0.0–2.0)
Acid-base deficit: 1 mmol/L (ref 0.0–2.0)
Bicarbonate: 25 mmol/L (ref 20.0–28.0)
Bicarbonate: 23.9 mmol/L (ref 20.0–28.0)
Calcium, Ion: 1.11 mmol/L — ABNORMAL LOW (ref 1.15–1.40)
Calcium, Ion: 1.17 mmol/L (ref 1.15–1.40)
HCT: 31 % — ABNORMAL LOW (ref 39.0–52.0)
HCT: 36 % — ABNORMAL LOW (ref 39.0–52.0)
Hemoglobin: 10.5 g/dL — ABNORMAL LOW (ref 13.0–17.0)
Hemoglobin: 12.2 g/dL — ABNORMAL LOW (ref 13.0–17.0)
O2 Saturation: 97 %
O2 Saturation: 90 %
Patient temperature: 98.2
Patient temperature: 98.5
Potassium: 3.7 mmol/L (ref 3.5–5.1)
Potassium: 4 mmol/L (ref 3.5–5.1)
Sodium: 141 mmol/L (ref 135–145)
Sodium: 137 mmol/L (ref 135–145)
TCO2: 26 mmol/L (ref 22–32)
TCO2: 25 mmol/L (ref 22–32)
pCO2 arterial: 34.2 mmHg (ref 32.0–48.0)
pCO2 arterial: 39.8 mmHg (ref 32.0–48.0)
pH, Arterial: 7.471 — ABNORMAL HIGH (ref 7.350–7.450)
pH, Arterial: 7.386 (ref 7.350–7.450)
pO2, Arterial: 85 mmHg (ref 83.0–108.0)
pO2, Arterial: 60 mmHg — ABNORMAL LOW (ref 83.0–108.0)

## 2021-03-15 LAB — BASIC METABOLIC PANEL
Anion gap: 12 (ref 5–15)
BUN: 17 mg/dL (ref 6–20)
CO2: 21 mmol/L — ABNORMAL LOW (ref 22–32)
Calcium: 7.7 mg/dL — ABNORMAL LOW (ref 8.9–10.3)
Chloride: 110 mmol/L (ref 98–111)
Creatinine, Ser: 0.64 mg/dL (ref 0.61–1.24)
GFR, Estimated: >60 mL/min (ref >60)
Glucose, Bld: 105 mg/dL — ABNORMAL HIGH (ref 70–99)
Potassium: 4.1 mmol/L (ref 3.5–5.1)
Sodium: 143 mmol/L (ref 135–145)

## 2021-03-15 LAB — I-STAT CHEM 8, ED
BUN: 15 mg/dL (ref 6–20)
Calcium, Ion: 1 mmol/L — ABNORMAL LOW (ref 1.15–1.40)
Chloride: 109 mmol/L (ref 98–111)
Creatinine, Ser: 1 mg/dL (ref 0.61–1.24)
Glucose, Bld: 180 mg/dL — ABNORMAL HIGH (ref 70–99)
HCT: 32 % — ABNORMAL LOW (ref 39.0–52.0)
Hemoglobin: 10.9 g/dL — ABNORMAL LOW (ref 13.0–17.0)
Potassium: 4.4 mmol/L (ref 3.5–5.1)
Sodium: 140 mmol/L (ref 135–145)
TCO2: 17 mmol/L — ABNORMAL LOW (ref 22–32)

## 2021-03-15 LAB — I-STAT VENOUS BLOOD GAS, ED
Acid-base deficit: 9 mmol/L — ABNORMAL HIGH (ref 0.0–2.0)
Bicarbonate: 16.7 mmol/L — ABNORMAL LOW (ref 20.0–28.0)
Calcium, Ion: 0.97 mmol/L — ABNORMAL LOW (ref 1.15–1.40)
HCT: 32 % — ABNORMAL LOW (ref 39.0–52.0)
Hemoglobin: 10.9 g/dL — ABNORMAL LOW (ref 13.0–17.0)
O2 Saturation: 89 %
Potassium: 4.3 mmol/L (ref 3.5–5.1)
Sodium: 140 mmol/L (ref 135–145)
TCO2: 18 mmol/L — ABNORMAL LOW (ref 22–32)
pCO2, Ven: 34.6 mmHg — ABNORMAL LOW (ref 44.0–60.0)
pH, Ven: 7.292 (ref 7.250–7.430)
pO2, Ven: 62 mmHg — ABNORMAL HIGH (ref 32.0–45.0)

## 2021-03-15 LAB — GLUCOSE, CAPILLARY
Glucose-Capillary: 76 mg/dL (ref 70–99)
Glucose-Capillary: 102 mg/dL — ABNORMAL HIGH (ref 70–99)

## 2021-03-15 LAB — ECHOCARDIOGRAM COMPLETE
Area-P 1/2: 7.22 cm2
Height: 71 in
S' Lateral: 2.8 cm
Weight: 2645.52 oz

## 2021-03-15 LAB — ETHANOL: Alcohol, Ethyl (B): 108 mg/dL — ABNORMAL HIGH (ref <10)

## 2021-03-15 LAB — TROPONIN I (HIGH SENSITIVITY)
Troponin I (High Sensitivity): 96 ng/L — ABNORMAL HIGH (ref <18)
Troponin I (High Sensitivity): 161 ng/L (ref <18)
Troponin I (High Sensitivity): 46 ng/L — ABNORMAL HIGH (ref <18)

## 2021-03-15 LAB — MAGNESIUM
Magnesium: 1.5 mg/dL — ABNORMAL LOW (ref 1.7–2.4)
Magnesium: 1.7 mg/dL (ref 1.7–2.4)

## 2021-03-15 LAB — TSH: TSH: 1.355 u[IU]/mL (ref 0.350–4.500)

## 2021-03-15 LAB — RAPID URINE DRUG SCREEN, HOSP PERFORMED
Amphetamines: NOT DETECTED
Barbiturates: NOT DETECTED
Benzodiazepines: POSITIVE — AB
Cocaine: POSITIVE — AB
Opiates: NOT DETECTED
Tetrahydrocannabinol: NOT DETECTED

## 2021-03-15 LAB — RESP PANEL BY RT-PCR (FLU A&B, COVID) ARPGX2
Influenza A by PCR: NEGATIVE
Influenza B by PCR: NEGATIVE
SARS Coronavirus 2 by RT PCR: NEGATIVE

## 2021-03-15 LAB — PHOSPHORUS
Phosphorus: 3.8 mg/dL (ref 2.5–4.6)
Phosphorus: 3 mg/dL (ref 2.5–4.6)

## 2021-03-15 LAB — MRSA NEXT GEN BY PCR, NASAL: MRSA by PCR Next Gen: NOT DETECTED

## 2021-03-15 LAB — HIV ANTIBODY (ROUTINE TESTING W REFLEX): HIV Screen 4th Generation wRfx: NONREACTIVE

## 2021-03-15 LAB — LACTIC ACID, PLASMA
Lactic Acid, Venous: 4.4 mmol/L (ref 0.5–1.9)
Lactic Acid, Venous: 1.2 mmol/L (ref 0.5–1.9)
Lactic Acid, Venous: 1.4 mmol/L (ref 0.5–1.9)

## 2021-03-15 MED ORDER — FOLIC ACID 1 MG PO TABS
1.0000 mg | ORAL_TABLET | Freq: Every day | ORAL | Status: DC
  Administered 2021-03-16 – 2021-03-17 (×2): 1 mg via ORAL
  Filled 2021-03-15 (×3): qty 1

## 2021-03-15 MED ORDER — DOCUSATE SODIUM 50 MG/5ML PO LIQD: 100.0000 mg | Freq: Two times a day (BID) | ORAL | Status: DC | PRN

## 2021-03-15 MED ORDER — NOREPINEPHRINE 4 MG/250ML-% IV SOLN
0.0000 ug/min | INTRAVENOUS | Status: DC
  Administered 2021-03-15: 6 ug/min via INTRAVENOUS

## 2021-03-15 MED ORDER — IPRATROPIUM-ALBUTEROL 0.5-2.5 (3) MG/3ML IN SOLN
3.0000 mL | Freq: Four times a day (QID) | RESPIRATORY_TRACT | Status: DC
  Administered 2021-03-15 – 2021-03-16 (×4): 3 mL via RESPIRATORY_TRACT
  Filled 2021-03-15 (×4): qty 3

## 2021-03-15 MED ORDER — PROPOFOL 1000 MG/100ML IV EMUL
0.0000 ug/kg/min | INTRAVENOUS | Status: DC
  Filled 2021-03-15 (×2): qty 100

## 2021-03-15 MED ORDER — FENTANYL BOLUS VIA INFUSION
50.0000 ug | INTRAVENOUS | Status: DC | PRN
  Filled 2021-03-15: qty 50

## 2021-03-15 MED ORDER — ADULT MULTIVITAMIN W/MINERALS CH
1.0000 | ORAL_TABLET | Freq: Every day | ORAL | Status: DC
  Administered 2021-03-15: 1
  Filled 2021-03-15: qty 1

## 2021-03-15 MED ORDER — LACTATED RINGERS IV BOLUS
1000.0000 mL | Freq: Once | INTRAVENOUS | Status: AC
  Administered 2021-03-15: 1000 mL via INTRAVENOUS

## 2021-03-15 MED ORDER — DEXMEDETOMIDINE HCL IN NACL 400 MCG/100ML IV SOLN
0.2000 ug/kg/h | INTRAVENOUS | Status: DC
  Administered 2021-03-16: 0.2 ug/kg/h via INTRAVENOUS
  Filled 2021-03-15: qty 100

## 2021-03-15 MED ORDER — FENTANYL 2500MCG IN NS 250ML (10MCG/ML) PREMIX INFUSION
25.0000 ug/h | INTRAVENOUS | Status: DC
  Administered 2021-03-15: 50 ug/h via INTRAVENOUS
  Filled 2021-03-15: qty 250

## 2021-03-15 MED ORDER — CHLORHEXIDINE GLUCONATE CLOTH 2 % EX PADS
6.0000 | MEDICATED_PAD | Freq: Every day | CUTANEOUS | Status: DC
  Administered 2021-03-17 – 2021-03-25 (×7): 6 via TOPICAL

## 2021-03-15 MED ORDER — ADULT MULTIVITAMIN W/MINERALS CH
1.0000 | ORAL_TABLET | Freq: Every day | ORAL | Status: DC
  Administered 2021-03-16 – 2021-03-17 (×2): 1 via ORAL
  Filled 2021-03-15 (×3): qty 1

## 2021-03-15 MED ORDER — FENTANYL CITRATE (PF) 100 MCG/2ML IJ SOLN: 50.0000 ug | INTRAMUSCULAR | Status: DC | PRN

## 2021-03-15 MED ORDER — LORAZEPAM 1 MG PO TABS: 1.0000 mg | ORAL_TABLET | ORAL | Status: DC | PRN

## 2021-03-15 MED ORDER — PANTOPRAZOLE SODIUM 40 MG IV SOLR
40.0000 mg | Freq: Every day | INTRAVENOUS | Status: DC
  Administered 2021-03-15: 40 mg via INTRAVENOUS
  Filled 2021-03-15: qty 40

## 2021-03-15 MED ORDER — MAGNESIUM SULFATE 2 GM/50ML IV SOLN
2.0000 g | Freq: Once | INTRAVENOUS | Status: AC
  Administered 2021-03-15: 2 g via INTRAVENOUS
  Filled 2021-03-15: qty 50

## 2021-03-15 MED ORDER — POLYETHYLENE GLYCOL 3350 17 G PO PACK
17.0000 g | PACK | Freq: Every day | ORAL | Status: DC
  Administered 2021-03-15: 17 g
  Filled 2021-03-15: qty 1

## 2021-03-15 MED ORDER — LORAZEPAM 2 MG/ML IJ SOLN: 1.0000 mg | INTRAMUSCULAR | Status: DC | PRN

## 2021-03-15 MED ORDER — FOLIC ACID 1 MG PO TABS
1.0000 mg | ORAL_TABLET | Freq: Every day | ORAL | Status: DC
  Administered 2021-03-15: 1 mg
  Filled 2021-03-15: qty 1

## 2021-03-15 MED ORDER — DOCUSATE SODIUM 50 MG/5ML PO LIQD
100.0000 mg | Freq: Two times a day (BID) | ORAL | Status: DC
  Administered 2021-03-15: 100 mg
  Filled 2021-03-15: qty 10

## 2021-03-15 MED ORDER — KETOROLAC TROMETHAMINE 30 MG/ML IJ SOLN
30.0000 mg | Freq: Three times a day (TID) | INTRAMUSCULAR | Status: DC | PRN
  Administered 2021-03-18: 30 mg via INTRAVENOUS
  Filled 2021-03-15: qty 1

## 2021-03-15 MED ORDER — BUDESONIDE 0.5 MG/2ML IN SUSP
0.5000 mg | Freq: Two times a day (BID) | RESPIRATORY_TRACT | Status: DC
  Administered 2021-03-15 – 2021-03-17 (×4): 0.5 mg via RESPIRATORY_TRACT
  Filled 2021-03-15 (×5): qty 2

## 2021-03-15 MED ORDER — HEPARIN SODIUM (PORCINE) 5000 UNIT/ML IJ SOLN
5000.0000 [IU] | Freq: Three times a day (TID) | INTRAMUSCULAR | Status: DC
  Administered 2021-03-15 – 2021-03-24 (×27): 5000 [IU] via SUBCUTANEOUS
  Filled 2021-03-15 (×26): qty 1

## 2021-03-15 MED ORDER — THIAMINE HCL 100 MG PO TABS
100.0000 mg | ORAL_TABLET | Freq: Every day | ORAL | Status: DC
  Administered 2021-03-15: 100 mg
  Filled 2021-03-15: qty 1

## 2021-03-15 MED ORDER — SODIUM CHLORIDE 0.9 % IV SOLN
250.0000 mL | INTRAVENOUS | Status: DC
  Administered 2021-03-18: 250 mL via INTRAVENOUS

## 2021-03-15 MED ORDER — POLYETHYLENE GLYCOL 3350 17 G PO PACK: 17.0000 g | PACK | Freq: Every day | ORAL | Status: DC | PRN

## 2021-03-15 MED ORDER — FENTANYL CITRATE (PF) 100 MCG/2ML IJ SOLN
50.0000 ug | Freq: Once | INTRAMUSCULAR | Status: AC
Start: 2021-03-15 — End: 2021-03-15
  Administered 2021-03-15: 50 ug via INTRAVENOUS

## 2021-03-15 MED ORDER — FENTANYL CITRATE (PF) 100 MCG/2ML IJ SOLN
25.0000 ug | INTRAMUSCULAR | Status: DC | PRN
  Administered 2021-03-15 (×2): 100 ug via INTRAVENOUS
  Filled 2021-03-15 (×2): qty 2

## 2021-03-15 MED ORDER — THIAMINE HCL 100 MG/ML IJ SOLN
100.0000 mg | Freq: Every day | INTRAMUSCULAR | Status: DC
  Administered 2021-03-16: 100 mg via INTRAVENOUS
  Filled 2021-03-15: qty 2

## 2021-03-15 MED ORDER — LORAZEPAM 2 MG/ML IJ SOLN
1.0000 mg | INTRAMUSCULAR | Status: DC | PRN
  Administered 2021-03-15 – 2021-03-16 (×4): 2 mg via INTRAVENOUS
  Administered 2021-03-16: 1 mg via INTRAVENOUS
  Administered 2021-03-16 – 2021-03-17 (×2): 2 mg via INTRAVENOUS
  Administered 2021-03-17: 1 mg via INTRAVENOUS
  Filled 2021-03-15 (×9): qty 1

## 2021-03-15 MED ORDER — THIAMINE HCL 100 MG PO TABS
100.0000 mg | ORAL_TABLET | Freq: Every day | ORAL | Status: DC
  Administered 2021-03-17: 100 mg via ORAL
  Filled 2021-03-15 (×2): qty 1

## 2021-03-15 NOTE — Progress Notes (Signed)
Pt transported to 2M13 from TRA B without any complications.

## 2021-03-15 NOTE — Progress Notes (Signed)
  Echocardiogram 2D Echocardiogram with 3D and strain has been performed.  Leta Jungling M 03/15/2021, 2:41 PM
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
## 2021-03-15 NOTE — Progress Notes (Signed)
eLink Physician-Brief Progress Note Patient Name: Andrew Wheeler DOB: August 15, 1966 MRN: [id number]   Date of Service  03/15/2021  HPI/Events of Note  Multiple issues: 1.Patient c/o SOB and 2. Anxiety - patient with Hx of ETOH and cocaine abuse.   eICU Interventions  Plan: ABG STAT. Portable CXR STAT. EKG STAT. Cycle troponin. Stepdown CIWA protocol. Will request that the ground team evaluate the patient at bedside.      Intervention Category Major Interventions: Other:;Delirium, psychosis, severe agitation - evaluation and management  Noris, Kulinski 03/15/2021, 10:24 PM
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
## 2021-03-15 NOTE — Plan of Care (Signed)
  Problem: Health Behavior/Discharge Planning: Goal: Ability to manage health-related needs will improve Outcome: Progressing   

## 2021-03-15 NOTE — Progress Notes (Signed)
eLink Physician-Brief Progress Note Patient Name: Andrew Wheeler DOB: Feb 20, 1966 MRN: [id number]   Date of Service  03/15/2021  HPI/Events of Note  ETOH withdrawal - Nursing request for increased sedation.   eICU Interventions  Plan: Precedex IV infusion (0.2 to 0.6 mcg/kg/hour). Titrate to RASS = 0.     Intervention Category Major Interventions: Delirium, psychosis, severe agitation - evaluation and management  Andrew Wheeler 03/15/2021, 11:49 PM

## 2021-03-15 NOTE — Progress Notes (Signed)
PCCM INTERVAL PROGRESS NOTE   Called to bedside to evaluate patient with SOB and anxiety. Rn reports patient awoke suddenly from sleep in some distress with O2 sats to the 70's, but pleth was not reliable at that time. He reports undiagnosed sleep apnea, not on NIV at home. Currently patient satting 99% on 4 L. Reports respiratory difficulty is due to sternal pain, likely from CPR. Worse with deep inspiration and worse with palpation. Patient borderline tachycardic and beginning to develop tremors raising concern for ETOH withdrawal. Last drink was approximately 36 hours ago and he is a daily drinker. Lungs clear to auscultation.    Shortness of breath: - CXR now - ABG ordered by EMD pending - Toradol for CPR related pain - CIWA scale ativan initiated - Consider scheduling phenobarbital if clear ETOH w/d presents itself.  - Thiamine, folate   Joneen Roach, AGACNP-BC [facility]  See Amion for personal pager PCCM on call pager [phone] until 7pm. Please call Elink 7p-7a. [phone]  03/15/2021 10:29 PM

## 2021-03-15 NOTE — ED Notes (Signed)
Pt sat straight up and was attempting to pull at ETT. Reoriented pt to room and situation and medicated with Versed due to being a danger to self. Pt is now relaxed and calm and propofol increased.

## 2021-03-15 NOTE — Procedures (Signed)
Extubation Procedure Note  Patient Details:   Name: Andrew Wheeler DOB: [birth year] MRN: [id number]   Airway Documentation:    Vent end date: 03/15/21 Vent end time: 1225   Evaluation  O2 sats: stable throughout Complications: No apparent complications Patient did tolerate procedure well. Bilateral Breath Sounds: Diminished   Patient extubated per MD order & placed on 4L [place]. Patient able to speak & has good strong cough post extubation. Patient had positive cuff leak prior to extubation.  Jacqulynn Cadet 03/15/2021, 12:30 PM
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
## 2021-03-16 ENCOUNTER — Inpatient Hospital Stay (HOSPITAL_COMMUNITY): Payer: Medicare HMO

## 2021-03-16 ENCOUNTER — Ambulatory Visit: Payer: Medicare HMO | Admitting: Physician Assistant

## 2021-03-16 DIAGNOSIS — I959 Hypotension, unspecified: Secondary | ICD-10-CM

## 2021-03-16 DIAGNOSIS — F1910 Other psychoactive substance abuse, uncomplicated: Secondary | ICD-10-CM

## 2021-03-16 LAB — CBC
HCT: 36.3 % — ABNORMAL LOW (ref 39.0–52.0)
Hemoglobin: 11.9 g/dL — ABNORMAL LOW (ref 13.0–17.0)
MCH: 30.8 pg (ref 26.0–34.0)
MCHC: 32.8 g/dL (ref 30.0–36.0)
MCV: 94 fL (ref 80.0–100.0)
Platelets: 139 10*3/uL — ABNORMAL LOW (ref 150–400)
RBC: 3.86 MIL/uL — ABNORMAL LOW (ref 4.22–5.81)
RDW: 14.3 % (ref 11.5–15.5)
WBC: 7.4 10*3/uL (ref 4.0–10.5)
nRBC: 0 % (ref 0.0–0.2)

## 2021-03-16 LAB — PHOSPHORUS: Phosphorus: 2.4 mg/dL — ABNORMAL LOW (ref 2.5–4.6)

## 2021-03-16 LAB — TROPONIN I (HIGH SENSITIVITY): Troponin I (High Sensitivity): 38 ng/L — ABNORMAL HIGH (ref <18)

## 2021-03-16 LAB — MAGNESIUM: Magnesium: 1.8 mg/dL (ref 1.7–2.4)

## 2021-03-16 MED ORDER — CHLORDIAZEPOXIDE HCL 25 MG PO CAPS
25.0000 mg | ORAL_CAPSULE | Freq: Four times a day (QID) | ORAL | Status: AC
  Administered 2021-03-16 – 2021-03-17 (×6): 25 mg via ORAL
  Filled 2021-03-16 (×6): qty 1

## 2021-03-16 MED ORDER — ADULT MULTIVITAMIN W/MINERALS CH: 1.0000 | ORAL_TABLET | Freq: Every day | ORAL | Status: DC

## 2021-03-16 MED ORDER — LOPERAMIDE HCL 2 MG PO CAPS
2.0000 mg | ORAL_CAPSULE | ORAL | Status: AC | PRN
  Filled 2021-03-16: qty 2

## 2021-03-16 MED ORDER — THIAMINE HCL 100 MG PO TABS: 100.0000 mg | ORAL_TABLET | Freq: Every day | ORAL | Status: DC

## 2021-03-16 MED ORDER — CHLORDIAZEPOXIDE HCL 25 MG PO CAPS
25.0000 mg | ORAL_CAPSULE | Freq: Every day | ORAL | Status: AC
  Administered 2021-03-19: 25 mg via ORAL
  Filled 2021-03-16: qty 1

## 2021-03-16 MED ORDER — MAGNESIUM SULFATE 2 GM/50ML IV SOLN
2.0000 g | Freq: Once | INTRAVENOUS | Status: AC
  Administered 2021-03-16: 2 g via INTRAVENOUS
  Filled 2021-03-16: qty 50

## 2021-03-16 MED ORDER — HYDROXYZINE HCL 25 MG PO TABS
25.0000 mg | ORAL_TABLET | Freq: Four times a day (QID) | ORAL | Status: AC | PRN
  Administered 2021-03-17 – 2021-03-18 (×2): 25 mg via ORAL
  Filled 2021-03-16 (×2): qty 1

## 2021-03-16 MED ORDER — IPRATROPIUM-ALBUTEROL 0.5-2.5 (3) MG/3ML IN SOLN
3.0000 mL | Freq: Two times a day (BID) | RESPIRATORY_TRACT | Status: DC
  Administered 2021-03-16 – 2021-03-17 (×2): 3 mL via RESPIRATORY_TRACT
  Filled 2021-03-16 (×2): qty 3

## 2021-03-16 MED ORDER — CHLORDIAZEPOXIDE HCL 25 MG PO CAPS
25.0000 mg | ORAL_CAPSULE | Freq: Once | ORAL | Status: AC
  Administered 2021-03-16: 25 mg via ORAL
  Filled 2021-03-16: qty 1

## 2021-03-16 MED ORDER — CHLORDIAZEPOXIDE HCL 25 MG PO CAPS
25.0000 mg | ORAL_CAPSULE | Freq: Three times a day (TID) | ORAL | Status: AC
  Administered 2021-03-17 – 2021-03-18 (×3): 25 mg via ORAL
  Filled 2021-03-16 (×3): qty 1

## 2021-03-16 MED ORDER — DOCUSATE SODIUM 50 MG/5ML PO LIQD: 100.0000 mg | Freq: Two times a day (BID) | ORAL | Status: DC | PRN

## 2021-03-16 MED ORDER — POLYETHYLENE GLYCOL 3350 17 G PO PACK: 17.0000 g | PACK | Freq: Every day | ORAL | Status: DC | PRN

## 2021-03-16 MED ORDER — CHLORDIAZEPOXIDE HCL 25 MG PO CAPS
25.0000 mg | ORAL_CAPSULE | ORAL | Status: AC
  Administered 2021-03-18: 25 mg via ORAL
  Filled 2021-03-16 (×2): qty 1

## 2021-03-16 MED ORDER — FENTANYL CITRATE (PF) 100 MCG/2ML IJ SOLN
25.0000 ug | INTRAMUSCULAR | Status: DC | PRN
  Administered 2021-03-17 – 2021-03-18 (×9): 25 ug via INTRAVENOUS
  Filled 2021-03-16 (×9): qty 2

## 2021-03-16 MED ORDER — ONDANSETRON 4 MG PO TBDP: 4.0000 mg | ORAL_TABLET | Freq: Four times a day (QID) | ORAL | Status: DC | PRN

## 2021-03-16 MED ORDER — CHLORDIAZEPOXIDE HCL 25 MG PO CAPS
25.0000 mg | ORAL_CAPSULE | Freq: Four times a day (QID) | ORAL | Status: AC | PRN
  Administered 2021-03-18: 25 mg via ORAL
  Filled 2021-03-16: qty 1
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
## 2021-03-16 NOTE — Progress Notes (Signed)
This Rn notice pt. Agitated, tremors visibile, sweating, and anxious. This Rn increased precdex medication, d/t pt. Receiving IV ativan recently. Rn clean up pt. Subsequently, This Rn re-entered room cleaning up pt. This Rn noticed that pt. Ripped out IV. Rn advised and counseled pt. to not interfere with medical devices. Pt. Agreed and responded he will not do it again. This Rn will continue to monitor patient closely.

## 2021-03-16 NOTE — Progress Notes (Signed)
Orthopedic Tech Progress Note Patient Details:  Andrew Wheeler 1966-05-28 [id number] Was called by NP B. OLLIS to apply a CAST on patient, Took off LONG ARM SPLINT and had help to apply a CAST. Added nice padding  Casting Type of Cast: Long arm cast Cast Location: RUE Cast Material: Fiberglass Cast Intervention: Application  Post Interventions Patient Tolerated: Well Instructions Provided: Care of device    Ortho Devices Type of Ortho Device: Sugartong splint Ortho Device/Splint Location: RUE Ortho Device/Splint Interventions: Removal   Post Interventions Patient Tolerated: Well Instructions Provided: Care of device  Andrew Wheeler 03/16/2021, 5:25 PM
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
## 2021-03-17 ENCOUNTER — Inpatient Hospital Stay (HOSPITAL_COMMUNITY): Payer: Medicare HMO

## 2021-03-17 LAB — CBC
HCT: 34.1 % — ABNORMAL LOW (ref 39.0–52.0)
Hemoglobin: 11.6 g/dL — ABNORMAL LOW (ref 13.0–17.0)
MCH: 31.5 pg (ref 26.0–34.0)
MCHC: 34 g/dL (ref 30.0–36.0)
MCV: 92.7 fL (ref 80.0–100.0)
Platelets: 140 10*3/uL — ABNORMAL LOW (ref 150–400)
RBC: 3.68 MIL/uL — ABNORMAL LOW (ref 4.22–5.81)
RDW: 13.4 % (ref 11.5–15.5)
WBC: 10.6 10*3/uL — ABNORMAL HIGH (ref 4.0–10.5)
nRBC: 0 % (ref 0.0–0.2)

## 2021-03-17 LAB — BASIC METABOLIC PANEL
Anion gap: 7 (ref 5–15)
BUN: 9 mg/dL (ref 6–20)
CO2: 26 mmol/L (ref 22–32)
Calcium: 8.1 mg/dL — ABNORMAL LOW (ref 8.9–10.3)
Chloride: 99 mmol/L (ref 98–111)
Creatinine, Ser: 0.69 mg/dL (ref 0.61–1.24)
GFR, Estimated: >60 mL/min (ref >60)
Glucose, Bld: 104 mg/dL — ABNORMAL HIGH (ref 70–99)
Potassium: 3.9 mmol/L (ref 3.5–5.1)
Sodium: 132 mmol/L — ABNORMAL LOW (ref 135–145)

## 2021-03-17 LAB — MAGNESIUM: Magnesium: 1.7 mg/dL (ref 1.7–2.4)

## 2021-03-17 MED ORDER — FLUTICASONE FUROATE-VILANTEROL 200-25 MCG/INH IN AEPB
1.0000 | INHALATION_SPRAY | Freq: Every day | RESPIRATORY_TRACT | Status: DC
  Administered 2021-03-18 – 2021-03-25 (×7): 1 via RESPIRATORY_TRACT
  Filled 2021-03-17: qty 28

## 2021-03-17 MED ORDER — ORAL CARE MOUTH RINSE
15.0000 mL | Freq: Two times a day (BID) | OROMUCOSAL | Status: DC
  Administered 2021-03-17 – 2021-03-24 (×9): 15 mL via OROMUCOSAL
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
## 2021-03-17 NOTE — Progress Notes (Addendum)
Attending:    Subjective: Resting quietly Off precedex R arm was casted yesterday  Objective: Vitals:   03/17/21 0700 03/17/21 0733 03/17/21 0758 03/17/21 0800  BP: 123/81     Pulse: 83     Resp: (!) 27     Temp:  98.9 F (37.2 C)    TempSrc:  Oral    SpO2: 98%  99% 99%  Weight:      Height:          Intake/Output Summary (Last 24 hours) at 03/17/2021 1025 Last data filed at 03/17/2021 0600 Gross per 24 hour  Intake 50 ml  Output 1575 ml  Net -1525 ml    General:  Resting comfortably in bed HENT: NCAT OP clear PULM: CTA B, normal effort CV: RRR, no mgr GI: BS+, soft, nontender MSK: R arm in cast Neuro: awake, alert, no distress, MAEW   CBC    Component Value Date/Time   WBC 10.6 (H) 03/17/2021 0218   RBC 3.68 (L) 03/17/2021 0218   HGB 11.6 (L) 03/17/2021 0218   HCT 34.1 (L) 03/17/2021 0218   PLT 140 (L) 03/17/2021 0218   MCV 92.7 03/17/2021 0218   MCH 31.5 03/17/2021 0218   MCHC 34.0 03/17/2021 0218   RDW 13.4 03/17/2021 0218   LYMPHSABS 0.5 (L) 03/15/2021 0135   MONOABS 0.7 03/15/2021 0135   EOSABS 0.0 03/15/2021 0135   BASOSABS 0.0 03/15/2021 0135    BMET    Component Value Date/Time   NA 132 (L) 03/17/2021 0218   K 3.9 03/17/2021 0218   CL 99 03/17/2021 0218   CO2 26 03/17/2021 0218   GLUCOSE 104 (H) 03/17/2021 0218   BUN 9 03/17/2021 0218   CREATININE 0.69 03/17/2021 0218   CALCIUM 8.1 (L) 03/17/2021 0218   GFRNONAA >60 03/17/2021 0218   GFRAA >60 04/29/2020 2322    CXR images reviewed, from 6/21, no infiltrate  Impression/Plan: EtOH withdrawal> continue librium taper, folate, B12 Cocaine abuse, alcohol dependence, narcotic dependence> seek options for substance abuse treatment after discharge Physical deconditioning > PT recommending SNF placement, continue inpatient PT options Concern for possible aspiration> SLP evaluation COPD by history > needs outpatient PFT, currently on budesonide and ipratropium> change to [facility] while in  hospital  To [facility]  My cc time n/a  Heber Oberlin, MD [place] [facility] Pager: [phone] Cell: [phone] After 3pm or if no response, call [phone]

## 2021-03-18 DIAGNOSIS — F319 Bipolar disorder, unspecified: Secondary | ICD-10-CM

## 2021-03-18 DIAGNOSIS — F419 Anxiety disorder, unspecified: Secondary | ICD-10-CM

## 2021-03-18 LAB — CBC
HCT: 36.9 % — ABNORMAL LOW (ref 39.0–52.0)
Hemoglobin: 12.2 g/dL — ABNORMAL LOW (ref 13.0–17.0)
MCH: 30.8 pg (ref 26.0–34.0)
MCHC: 33.1 g/dL (ref 30.0–36.0)
MCV: 93.2 fL (ref 80.0–100.0)
Platelets: 189 10*3/uL (ref 150–400)
RBC: 3.96 MIL/uL — ABNORMAL LOW (ref 4.22–5.81)
RDW: 13.2 % (ref 11.5–15.5)
WBC: 9 10*3/uL (ref 4.0–10.5)
nRBC: 0 % (ref 0.0–0.2)

## 2021-03-18 LAB — BASIC METABOLIC PANEL
Anion gap: 8 (ref 5–15)
BUN: 10 mg/dL (ref 6–20)
CO2: 28 mmol/L (ref 22–32)
Calcium: 8.4 mg/dL — ABNORMAL LOW (ref 8.9–10.3)
Chloride: 97 mmol/L — ABNORMAL LOW (ref 98–111)
Creatinine, Ser: 0.65 mg/dL (ref 0.61–1.24)
GFR, Estimated: >60 mL/min (ref >60)
Glucose, Bld: 141 mg/dL — ABNORMAL HIGH (ref 70–99)
Potassium: 3.7 mmol/L (ref 3.5–5.1)
Sodium: 133 mmol/L — ABNORMAL LOW (ref 135–145)

## 2021-03-18 MED ORDER — BUSPIRONE HCL 15 MG PO TABS
15.0000 mg | ORAL_TABLET | Freq: Three times a day (TID) | ORAL | Status: DC
  Administered 2021-03-18 – 2021-03-22 (×12): 15 mg via ORAL
  Filled 2021-03-18 (×13): qty 1

## 2021-03-18 MED ORDER — POTASSIUM CHLORIDE CRYS ER 20 MEQ PO TBCR
40.0000 meq | EXTENDED_RELEASE_TABLET | Freq: Once | ORAL | Status: AC
  Administered 2021-03-18: 40 meq via ORAL
  Filled 2021-03-18: qty 2

## 2021-03-18 MED ORDER — LORAZEPAM 1 MG PO TABS
1.0000 mg | ORAL_TABLET | ORAL | Status: AC | PRN
  Administered 2021-03-18 – 2021-03-19 (×4): 1 mg via ORAL
  Administered 2021-03-20 – 2021-03-21 (×7): 2 mg via ORAL
  Filled 2021-03-18 (×3): qty 2
  Filled 2021-03-18: qty 1
  Filled 2021-03-18 (×3): qty 2
  Filled 2021-03-18 (×2): qty 1
  Filled 2021-03-18: qty 2
  Filled 2021-03-18: qty 1

## 2021-03-18 MED ORDER — ACETAMINOPHEN 650 MG RE SUPP: 650.0000 mg | Freq: Four times a day (QID) | RECTAL | Status: DC | PRN

## 2021-03-18 MED ORDER — LORAZEPAM 2 MG/ML IJ SOLN
0.0000 mg | Freq: Two times a day (BID) | INTRAMUSCULAR | Status: DC
Start: 2021-03-20 — End: 2021-03-18

## 2021-03-18 MED ORDER — ADULT MULTIVITAMIN W/MINERALS CH
1.0000 | ORAL_TABLET | Freq: Every day | ORAL | Status: DC
  Administered 2021-03-18 – 2021-03-25 (×8): 1 via ORAL
  Filled 2021-03-18 (×8): qty 1

## 2021-03-18 MED ORDER — HYDROCODONE-ACETAMINOPHEN 5-325 MG PO TABS
1.0000 | ORAL_TABLET | ORAL | Status: DC | PRN
  Administered 2021-03-19 (×2): 1 via ORAL
  Administered 2021-03-20: 2 via ORAL
  Administered 2021-03-20 (×2): 1 via ORAL
  Administered 2021-03-21 – 2021-03-23 (×5): 2 via ORAL
  Administered 2021-03-23: 1 via ORAL
  Filled 2021-03-18 (×4): qty 2
  Filled 2021-03-18 (×2): qty 1
  Filled 2021-03-18 (×2): qty 2
  Filled 2021-03-18 (×3): qty 1

## 2021-03-18 MED ORDER — PREGABALIN 75 MG PO CAPS
150.0000 mg | ORAL_CAPSULE | Freq: Two times a day (BID) | ORAL | Status: DC
  Administered 2021-03-18 – 2021-03-20 (×4): 150 mg via ORAL
  Filled 2021-03-18 (×5): qty 2

## 2021-03-18 MED ORDER — THIAMINE HCL 100 MG/ML IJ SOLN
100.0000 mg | Freq: Every day | INTRAMUSCULAR | Status: DC
  Administered 2021-03-19: 100 mg via INTRAVENOUS
  Filled 2021-03-18: qty 2

## 2021-03-18 MED ORDER — PANTOPRAZOLE SODIUM 40 MG PO TBEC
40.0000 mg | DELAYED_RELEASE_TABLET | Freq: Every day | ORAL | Status: DC
  Administered 2021-03-18 – 2021-03-25 (×7): 40 mg via ORAL
  Filled 2021-03-18 (×8): qty 1

## 2021-03-18 MED ORDER — KETOROLAC TROMETHAMINE 30 MG/ML IJ SOLN
30.0000 mg | Freq: Three times a day (TID) | INTRAMUSCULAR | Status: DC
  Filled 2021-03-18: qty 1

## 2021-03-18 MED ORDER — HYDROXYZINE PAMOATE 25 MG PO CAPS: 25.0000 mg | ORAL_CAPSULE | Freq: Three times a day (TID) | ORAL | Status: DC | PRN

## 2021-03-18 MED ORDER — DULOXETINE HCL 60 MG PO CPEP
90.0000 mg | ORAL_CAPSULE | Freq: Every day | ORAL | Status: DC
  Administered 2021-03-18 – 2021-03-22 (×5): 90 mg via ORAL
  Filled 2021-03-18: qty 1
  Filled 2021-03-18 (×2): qty 3
  Filled 2021-03-18: qty 1
  Filled 2021-03-18 (×2): qty 3

## 2021-03-18 MED ORDER — LORAZEPAM 2 MG/ML IJ SOLN: 0.0000 mg | Freq: Four times a day (QID) | INTRAMUSCULAR | Status: DC

## 2021-03-18 MED ORDER — MAGNESIUM SULFATE 2 GM/50ML IV SOLN
2.0000 g | Freq: Once | INTRAVENOUS | Status: AC
  Administered 2021-03-18: 2 g via INTRAVENOUS
  Filled 2021-03-18: qty 50

## 2021-03-18 MED ORDER — ONDANSETRON HCL 4 MG PO TABS: 4.0000 mg | ORAL_TABLET | Freq: Four times a day (QID) | ORAL | Status: DC | PRN

## 2021-03-18 MED ORDER — BUPRENORPHINE HCL 8 MG SL SUBL: 8.0000 mg | SUBLINGUAL_TABLET | Freq: Three times a day (TID) | SUBLINGUAL | Status: DC

## 2021-03-18 MED ORDER — ACETAMINOPHEN 325 MG PO TABS
650.0000 mg | ORAL_TABLET | Freq: Four times a day (QID) | ORAL | Status: DC | PRN
  Administered 2021-03-24 – 2021-03-25 (×3): 650 mg via ORAL
  Filled 2021-03-18 (×3): qty 2

## 2021-03-18 MED ORDER — DOCUSATE SODIUM 100 MG PO CAPS
100.0000 mg | ORAL_CAPSULE | Freq: Two times a day (BID) | ORAL | Status: DC
  Administered 2021-03-18 – 2021-03-25 (×14): 100 mg via ORAL
  Filled 2021-03-18 (×15): qty 1

## 2021-03-18 MED ORDER — LORAZEPAM 2 MG/ML IJ SOLN
1.0000 mg | INTRAMUSCULAR | Status: AC | PRN
  Administered 2021-03-19: 1 mg via INTRAVENOUS
  Administered 2021-03-20: 2 mg via INTRAVENOUS
  Filled 2021-03-18 (×2): qty 1

## 2021-03-18 MED ORDER — OLANZAPINE 10 MG PO TABS
10.0000 mg | ORAL_TABLET | Freq: Every day | ORAL | Status: DC
  Administered 2021-03-18 – 2021-03-23 (×6): 10 mg via ORAL
  Filled 2021-03-18 (×7): qty 1

## 2021-03-18 MED ORDER — ONDANSETRON HCL 4 MG/2ML IJ SOLN: 4.0000 mg | Freq: Four times a day (QID) | INTRAMUSCULAR | Status: DC | PRN

## 2021-03-18 MED ORDER — FENTANYL CITRATE (PF) 100 MCG/2ML IJ SOLN
12.5000 ug | INTRAMUSCULAR | Status: DC | PRN
  Administered 2021-03-18 (×3): 12.5 ug via INTRAVENOUS
  Filled 2021-03-18 (×3): qty 2

## 2021-03-18 MED ORDER — THIAMINE HCL 100 MG PO TABS
100.0000 mg | ORAL_TABLET | Freq: Every day | ORAL | Status: DC
  Administered 2021-03-18 – 2021-03-25 (×7): 100 mg via ORAL
  Filled 2021-03-18 (×6): qty 1

## 2021-03-18 MED ORDER — KETOROLAC TROMETHAMINE 30 MG/ML IJ SOLN
30.0000 mg | Freq: Four times a day (QID) | INTRAMUSCULAR | Status: AC
  Administered 2021-03-18 – 2021-03-23 (×20): 30 mg via INTRAVENOUS
  Filled 2021-03-18 (×19): qty 1

## 2021-03-18 MED ORDER — BUPROPION HCL ER (XL) 150 MG PO TB24
300.0000 mg | ORAL_TABLET | Freq: Every day | ORAL | Status: DC
  Administered 2021-03-18 – 2021-03-25 (×8): 300 mg via ORAL
  Filled 2021-03-18 (×2): qty 2
  Filled 2021-03-18: qty 1
  Filled 2021-03-18: qty 2
  Filled 2021-03-18 (×2): qty 1
  Filled 2021-03-18 (×2): qty 2

## 2021-03-18 MED ORDER — FOLIC ACID 1 MG PO TABS
1.0000 mg | ORAL_TABLET | Freq: Every day | ORAL | Status: DC
  Administered 2021-03-18 – 2021-03-25 (×7): 1 mg via ORAL
  Filled 2021-03-18 (×7): qty 1

## 2021-03-18 MED ORDER — MOMETASONE FURO-FORMOTEROL FUM 200-5 MCG/ACT IN AERO: 2.0000 | INHALATION_SPRAY | Freq: Two times a day (BID) | RESPIRATORY_TRACT | Status: DC
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
## 2021-03-18 NOTE — Progress Notes (Addendum)
Pt had a pair of blue jean and blue boxer that were cut upon admission. Pt blue jeans and boxers were thrown away in trash. Pt aware and said it was ok to throw away clothing. Pt also have $52(1-$50, 2-$2) $0.94(3 quarters, 1 dime, 1 nickel and 4 pennies). Pt asked if he could keep money at beside, this count was witness by myself and Dwyane Luo, RN.

## 2021-03-18 NOTE — Progress Notes (Addendum)
Eight (8) 150mg  pregablin capsules found on floor after falling out of patient's gown coming out of the bathroom. Per Dayshift RN patient's  personal prescription was taken to pharmacy after being found in his personal belongings. RN asked what the pills were doing on the floor or where they came from. Patient reports having no idea where pills came from despite coming out of his gown. Patient denies taking having any more in his possession. RN explicitly stated patients are not to take any of their own personal prescriptions during the hospital stay as hospital will provide them for patient. Eight (8) pregablin capsules disposed in Stericycle with .   Training and development officer RN
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
## 2021-03-19 ENCOUNTER — Other Ambulatory Visit: Payer: Self-pay

## 2021-03-19 ENCOUNTER — Encounter (HOSPITAL_COMMUNITY): Payer: Self-pay | Admitting: Pulmonary Disease

## 2021-03-19 DIAGNOSIS — F10231 Alcohol dependence with withdrawal delirium: Secondary | ICD-10-CM

## 2021-03-19 DIAGNOSIS — T50901A Poisoning by unspecified drugs, medicaments and biological substances, accidental (unintentional), initial encounter: Secondary | ICD-10-CM | POA: Diagnosis present

## 2021-03-19 LAB — BASIC METABOLIC PANEL
Anion gap: 10 (ref 5–15)
BUN: 12 mg/dL (ref 6–20)
CO2: 26 mmol/L (ref 22–32)
Calcium: 8.5 mg/dL — ABNORMAL LOW (ref 8.9–10.3)
Chloride: 97 mmol/L — ABNORMAL LOW (ref 98–111)
Creatinine, Ser: 0.67 mg/dL (ref 0.61–1.24)
GFR, Estimated: >60 mL/min (ref >60)
Glucose, Bld: 136 mg/dL — ABNORMAL HIGH (ref 70–99)
Potassium: 3.3 mmol/L — ABNORMAL LOW (ref 3.5–5.1)
Sodium: 133 mmol/L — ABNORMAL LOW (ref 135–145)

## 2021-03-19 LAB — GLUCOSE, CAPILLARY
Glucose-Capillary: 127 mg/dL — ABNORMAL HIGH (ref 70–99)
Glucose-Capillary: 190 mg/dL — ABNORMAL HIGH (ref 70–99)
Glucose-Capillary: 165 mg/dL — ABNORMAL HIGH (ref 70–99)
Glucose-Capillary: 199 mg/dL — ABNORMAL HIGH (ref 70–99)

## 2021-03-19 MED ORDER — LORAZEPAM 2 MG/ML IJ SOLN
2.0000 mg | Freq: Once | INTRAMUSCULAR | Status: AC
  Administered 2021-03-19: 2 mg via INTRAVENOUS
  Filled 2021-03-19: qty 1

## 2021-03-19 MED ORDER — FOLIC ACID 5 MG/ML IJ SOLN
1.0000 mg | Freq: Once | INTRAMUSCULAR | Status: AC
  Administered 2021-03-19: 1 mg via INTRAVENOUS
  Filled 2021-03-19: qty 0.2

## 2021-03-19 MED ORDER — POTASSIUM CHLORIDE 10 MEQ/100ML IV SOLN
10.0000 meq | INTRAVENOUS | Status: AC
  Administered 2021-03-19 (×4): 10 meq via INTRAVENOUS
  Filled 2021-03-19 (×4): qty 100

## 2021-03-19 MED ORDER — PANTOPRAZOLE SODIUM 40 MG IV SOLR
40.0000 mg | Freq: Once | INTRAVENOUS | Status: AC
  Administered 2021-03-19: 40 mg via INTRAVENOUS
  Filled 2021-03-19: qty 40

## 2021-03-19 MED ORDER — MAGNESIUM SULFATE 2 GM/50ML IV SOLN
2.0000 g | Freq: Once | INTRAVENOUS | Status: AC
  Administered 2021-03-19: 2 g via INTRAVENOUS
  Filled 2021-03-19: qty 50

## 2021-03-19 MED ORDER — POTASSIUM CHLORIDE CRYS ER 20 MEQ PO TBCR: 40.0000 meq | EXTENDED_RELEASE_TABLET | Freq: Once | ORAL | Status: DC

## 2021-03-19 MED ORDER — POTASSIUM CHLORIDE IN NACL 20-0.9 MEQ/L-% IV SOLN
INTRAVENOUS | Status: DC
  Filled 2021-03-19 (×3): qty 1000

## 2021-03-19 MED ORDER — HALOPERIDOL LACTATE 5 MG/ML IJ SOLN
5.0000 mg | Freq: Once | INTRAMUSCULAR | Status: AC | PRN
  Administered 2021-03-19: 5 mg via INTRAVENOUS
  Filled 2021-03-19: qty 1

## 2021-03-19 NOTE — Progress Notes (Signed)
TRH night shift.  The nursing staff reported that the patient is very restless, confused and trying to get out of bed.  When seen at bedside, he was only oriented to name, he thought he was at a clinic, disoriented to time/date and situation.  He was given lorazepam 1 mg p.o. earlier in the evening without significant results.  He received lorazepam 1mg  during dayshift as well.  Mild tongue fasciculations and tremors.  Lungs are clear.  Cardiovascular S1-S2, mildly tachycardic.  Abdomen is soft.  Lorazepam 2 mg IVP given earlier with some improvement, but the patient continues trying to leave his bed.  Haloperidol 5 mg IVP and magnesium sulfate 2 g IVPB ordered.  , MD.
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
## 2021-03-20 LAB — BASIC METABOLIC PANEL
Anion gap: 6 (ref 5–15)
BUN: 9 mg/dL (ref 6–20)
CO2: 26 mmol/L (ref 22–32)
Calcium: 8.4 mg/dL — ABNORMAL LOW (ref 8.9–10.3)
Chloride: 108 mmol/L (ref 98–111)
Creatinine, Ser: 0.63 mg/dL (ref 0.61–1.24)
GFR, Estimated: >60 mL/min (ref >60)
Glucose, Bld: 161 mg/dL — ABNORMAL HIGH (ref 70–99)
Potassium: 4.2 mmol/L (ref 3.5–5.1)
Sodium: 140 mmol/L (ref 135–145)

## 2021-03-20 LAB — MAGNESIUM: Magnesium: 1.9 mg/dL (ref 1.7–2.4)

## 2021-03-20 LAB — GLUCOSE, CAPILLARY
Glucose-Capillary: 157 mg/dL — ABNORMAL HIGH (ref 70–99)
Glucose-Capillary: 134 mg/dL — ABNORMAL HIGH (ref 70–99)

## 2021-03-20 MED ORDER — PREGABALIN 25 MG PO CAPS
150.0000 mg | ORAL_CAPSULE | Freq: Three times a day (TID) | ORAL | Status: DC
  Administered 2021-03-20 – 2021-03-25 (×15): 150 mg via ORAL
  Filled 2021-03-20 (×15): qty 2

## 2021-03-20 NOTE — Plan of Care (Signed)
Patient impulsive, but redirectable by staff

## 2021-03-20 NOTE — Progress Notes (Signed)
Pts money, wallet, keys and knife secured in envelope and sent to security to be locked up prior to pt being transferred to [facility]. Pt aware of this, and signed envelope with 2nd RN verification.
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
## 2021-03-21 MED ORDER — HYDROXYZINE HCL 25 MG PO TABS
25.0000 mg | ORAL_TABLET | Freq: Four times a day (QID) | ORAL | Status: DC | PRN
  Administered 2021-03-21 – 2021-03-23 (×3): 25 mg via ORAL
  Filled 2021-03-21 (×3): qty 1

## 2021-03-21 MED ORDER — QUETIAPINE FUMARATE 25 MG PO TABS
25.0000 mg | ORAL_TABLET | Freq: Two times a day (BID) | ORAL | Status: DC
  Administered 2021-03-21 (×2): 25 mg via ORAL
  Filled 2021-03-21 (×2): qty 1
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
## 2021-03-22 MED ORDER — DULOXETINE HCL 60 MG PO CPEP
60.0000 mg | ORAL_CAPSULE | Freq: Two times a day (BID) | ORAL | Status: DC
  Administered 2021-03-22 – 2021-03-25 (×6): 60 mg via ORAL
  Filled 2021-03-22 (×6): qty 1

## 2021-03-22 MED ORDER — OLANZAPINE 2.5 MG PO TABS
2.5000 mg | ORAL_TABLET | Freq: Every day | ORAL | Status: DC
  Administered 2021-03-22 – 2021-03-24 (×3): 2.5 mg via ORAL
  Filled 2021-03-22 (×3): qty 1

## 2021-03-22 NOTE — Care Management Important Message (Signed)
Important Message  Patient Details  Name: Andrew Wheeler MRN: [id number] Date of Birth: Jul 04, 1966   Medicare Important Message Given:  Yes     Rheda Kassab Stefan [place] 03/22/2021, 2:56 PM

## 2021-03-22 NOTE — Care Management Important Message (Signed)
Important Message  Patient Details  Name: Andrew Wheeler MRN: [id number] Date of Birth: [birth year]   Medicare Important Message Given:  Yes - Important Message mailed due to current National Emergency   Verbal consent obtained due to current National Emergency  Relationship to patient: Self Contact Name: Finnis Call Date: 03/22/21  Time: 1247 Phone: [phone] Outcome: Spoke with contact Important Message mailed to: Patient address on file   [name] 03/22/2021, 12:47 PM
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
## 2021-03-23 LAB — BASIC METABOLIC PANEL
Anion gap: 5 (ref 5–15)
BUN: 15 mg/dL (ref 6–20)
CO2: 27 mmol/L (ref 22–32)
Calcium: 8.9 mg/dL (ref 8.9–10.3)
Chloride: 107 mmol/L (ref 98–111)
Creatinine, Ser: 0.68 mg/dL (ref 0.61–1.24)
GFR, Estimated: >60 mL/min (ref >60)
Glucose, Bld: 165 mg/dL — ABNORMAL HIGH (ref 70–99)
Potassium: 3.7 mmol/L (ref 3.5–5.1)
Sodium: 139 mmol/L (ref 135–145)

## 2021-03-23 LAB — TSH: TSH: 1.25 u[IU]/mL (ref 0.350–4.500)

## 2021-03-23 LAB — VITAMIN B12: Vitamin B-12: 235 pg/mL (ref 180–914)

## 2021-03-23 MED ORDER — TRAMADOL HCL 50 MG PO TABS
50.0000 mg | ORAL_TABLET | Freq: Four times a day (QID) | ORAL | Status: DC | PRN
  Administered 2021-03-23 – 2021-03-25 (×7): 50 mg via ORAL
  Filled 2021-03-23 (×7): qty 1

## 2021-03-23 MED ORDER — IBUPROFEN 400 MG PO TABS
600.0000 mg | ORAL_TABLET | Freq: Once | ORAL | Status: AC
  Administered 2021-03-24: 600 mg via ORAL
  Filled 2021-03-23: qty 1

## 2021-03-23 MED ORDER — VITAMIN B-12 1000 MCG PO TABS
1000.0000 ug | ORAL_TABLET | Freq: Every day | ORAL | Status: DC
  Administered 2021-03-23 – 2021-03-25 (×3): 1000 ug via ORAL
  Filled 2021-03-23 (×3): qty 1
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
## 2021-03-23 NOTE — Progress Notes (Signed)
Pt ambulating in the hallway and stated he felt an increase of rib cage page, exacerbated when taking deep breaths. Vitals WDL. EKG performed. Pt given PRN tramadol. Pt states that does not help his pain but he would like to still take medication. Pt requested returning back to receiving Vicodin, but he was re-educated the medication was discontinued. Pt resting comfortably in bed. Pt asked for snack and ate yogurt, four packets of gram crackers, pretzel chips, a sandwich, and drank two can of sodas. Pt educated that after eating meals, he should not lay flat to also prevent chest discomfort and pain. Pt anxious and lights dimmed in room to relax patient. Provider paged and made aware pt requesting for more pain meds.
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
## 2021-03-23 NOTE — TOC Initial Note (Signed)
Transition of Care [facility]) - Initial/Assessment Note    Patient Details  Name: Andrew Wheeler MRN: [id number] Date of Birth: 06/27/1966  [facility] CM/SW Contact:    Kingsley Plan, RN Phone Number: 03/23/2021, 11:22 AM  Clinical Narrative:                  Spoke to patient at bedside at length.   Patient was staying at [facility] prior to admission.   Patient called [facility] [address] and spoke to [place]. [name] was on speaker phone and told the patient he no longer has a bed at [facility] and he will not be allowed back for 6 months.   NCM provided list of shelters and [place] resources. Patient states he has no family/friends close by. He is interested in a drug rehab program.   NCM provided substance abuse resources.   Patient states he gets paid on Friday and if discharged today he will stay in his car.   Patient has Important Message from Medicare at bedside.    NCM provided clothes and shoes from cloths closet    Expected Discharge Plan: Homeless Shelter     Patient Goals and CMS Choice   CMS Medicare.gov Compare Post Acute Care list provided to:: Patient    Expected Discharge Plan and Services Expected Discharge Plan: Homeless Shelter   Discharge Planning Services: CM Consult   Living arrangements for the past 2 months: Homeless Shelter                   DME Agency: NA       HH Arranged: NA          Prior Living Arrangements/Services Living arrangements for the past 2 months: Homeless Shelter   Patient language and need for interpreter reviewed:: Yes              Criminal Activity/Legal Involvement Pertinent to Current Situation/Hospitalization: No - Comment as needed  Activities of Daily Living Home Assistive Devices/Equipment: None ADL Screening (condition at time of admission) Patient's cognitive ability adequate to safely complete daily activities?: Yes Is the patient deaf or have difficulty hearing?: No Does the patient  have difficulty seeing, even when wearing glasses/contacts?: No Does the patient have difficulty concentrating, remembering, or making decisions?: No Patient able to express need for assistance with ADLs?: Yes Does the patient have difficulty dressing or bathing?: No Independently performs ADLs?: Yes (appropriate for developmental age) Does the patient have difficulty walking or climbing stairs?: No Weakness of Legs: None Weakness of Arms/Hands: Right  Permission Sought/Granted   Permission granted to share information with : Yes, Verbal Permission Granted  Share Information with NAME: [name] at [facility]           Emotional Assessment Appearance:: Appears stated age Attitude/Demeanor/Rapport: Engaged Affect (typically observed): Accepting   Alcohol / Substance Use: Illicit Drugs Psych Involvement: Yes (comment)  Admission diagnosis:  Cardiac arrest (HCC) [I46.9] Respiration abnormal [R06.9] Encephalopathy [G93.40] Polysubstance abuse (HCC) [F19.10] Hypotension, unspecified hypotension type [I95.9] Patient Active Problem List   Diagnosis Date Noted   Overdose, accidental or unintentional, initial encounter 03/19/2021   Cardiac arrest (HCC) 03/15/2021   Encephalopathy    Hypotension    Cocaine use disorder, moderate, dependence (HCC) 10/18/2020   Encounter for monitoring Suboxone maintenance therapy 10/18/2020   Cannabis use disorder, moderate, dependence (HCC) 06/17/2020   Methamphetamine use disorder, severe (HCC) 06/17/2020   Opioid dependence, continuous (HCC) 08/08/2019   Alcohol dependence, uncomplicated (HCC)  05/15/2018   Alcohol use disorder, severe, dependence (HCC) 05/10/2018   Anxiety 05/10/2018   Benign prostatic hyperplasia with weak urinary stream 05/10/2018   Chronic pain syndrome 05/10/2018   COPD with asthma (HCC) 05/10/2018   Essential hypertension 05/10/2018   Gastroesophageal reflux disease 05/10/2018   Hypothyroidism 05/10/2018   Housing lack  05/10/2018   Moderate episode of recurrent major depressive disorder (HCC) 05/10/2018   Polysubstance abuse (HCC) 05/10/2018   PCP:  Pcp, No Pharmacy:   [id number] [place], [place] - [address] [place] [address] Phone: [phone] Fax: [phone]     Social Determinants of Health (SDOH) Interventions    Readmission Risk Interventions No flowsheet data found.
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
## 2021-03-23 NOTE — Discharge Instructions (Addendum)
Psychiatry Follow up Please come to [facility] (this facility) during walk in hours for appointment with psychiatrist for further medication management and for therapists for therapy.    Walk in hours are 8-11 AM Monday through Thursday for medication management.Child and adolescent psychiatrists are only available on Wednesdays and Thursdays during walk in hours.  Therapy walk in hours are Monday-Wednesday 8 AM-1PM.   It is first come, first -serve; it is best to arrive by 7:00 AM.   On Friday from 1 pm to 4 pm for therapy intake only. Please arrive by 12:00 pm as it is  first come, first -serve.    When you arrive please go upstairs for your appointment. If you are unsure of where to go, inform the front desk that you are here for a walk in appointment and they will assist you with directions upstairs.  Address:  [address], in [place], [address] Ph: [phone]     Please Follow up with Dr [name] orthopedic in 1 week.  Weightbearing as tolerated  right elbow.   Do not drive if you are taking tramadol.
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
## 2021-03-23 NOTE — Progress Notes (Signed)
VAST consult received to obtain IV access. Patient had IV in upper arm which was dc'd d/t leaking beneath tissue. Review of MAR shows PO alternative for IV Thiamine. At this time  he does not have any other meds/fluids/studies ordered that necessitate IV placement. Darlyn Chamber, RN if patient's circumstances change and he needs an IV to place IVT consult.

## 2021-03-24 ENCOUNTER — Inpatient Hospital Stay (HOSPITAL_COMMUNITY): Payer: Medicare HMO

## 2021-03-24 MED ORDER — OLANZAPINE 5 MG PO TABS
15.0000 mg | ORAL_TABLET | Freq: Every day | ORAL | Status: DC
  Administered 2021-03-24: 15 mg via ORAL
  Filled 2021-03-24 (×2): qty 1

## 2021-03-24 MED ORDER — HYDROCERIN EX CREA
TOPICAL_CREAM | Freq: Two times a day (BID) | CUTANEOUS | Status: DC
  Filled 2021-03-24: qty 113

## 2021-03-24 MED ORDER — ALBUTEROL SULFATE (2.5 MG/3ML) 0.083% IN NEBU: 3.0000 mL | INHALATION_SOLUTION | Freq: Four times a day (QID) | RESPIRATORY_TRACT | Status: DC | PRN

## 2021-03-24 MED ORDER — OLANZAPINE 5 MG PO TABS
5.0000 mg | ORAL_TABLET | Freq: Every day | ORAL | Status: DC
  Administered 2021-03-25: 5 mg via ORAL
  Filled 2021-03-24: qty 1

## 2021-03-24 MED ORDER — ALBUTEROL SULFATE (2.5 MG/3ML) 0.083% IN NEBU: 3.0000 mL | INHALATION_SOLUTION | Freq: Four times a day (QID) | RESPIRATORY_TRACT | Status: DC

## 2021-03-24 MED ORDER — GUAIFENESIN ER 600 MG PO TB12
600.0000 mg | ORAL_TABLET | Freq: Two times a day (BID) | ORAL | Status: DC
  Administered 2021-03-24 – 2021-03-25 (×3): 600 mg via ORAL
  Filled 2021-03-24 (×3): qty 1

## 2021-03-24 MED ORDER — COVID-19 MRNA VAC-TRIS(PFIZER) 30 MCG/0.3ML IM SUSP
0.3000 mL | Freq: Once | INTRAMUSCULAR | Status: AC
  Administered 2021-03-24: 0.3 mL via INTRAMUSCULAR
  Filled 2021-03-24: qty 0.3

## 2021-03-24 MED ORDER — IBUPROFEN 400 MG PO TABS
400.0000 mg | ORAL_TABLET | Freq: Four times a day (QID) | ORAL | Status: DC | PRN
  Administered 2021-03-24 – 2021-03-25 (×3): 400 mg via ORAL
  Filled 2021-03-24 (×3): qty 1
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
## 2021-03-24 NOTE — Progress Notes (Signed)
Orthopedic Tech Progress Note Patient Details:  Trent Gabler [birth year] [id number]  Casting Type of Cast: Long arm cast Cast Location: Right Arm Cast Material: Fiberglass Cast Intervention: Removal  Post Interventions Patient Tolerated: Well Instructions Provided: Care of device    Ortho Devices Type of Ortho Device: Long arm splint, Arm sling Ortho Device/Splint Location: Right Arm Ortho Device/Splint Interventions: Application   Post Interventions Patient Tolerated: Well Instructions Provided: Care of device  Brynnlie Unterreiner E Xadrian Craighead 03/24/2021, 3:05 PM
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
## 2021-03-25 MED ORDER — ADULT MULTIVITAMIN W/MINERALS CH: 1.0000 | ORAL_TABLET | Freq: Every day | ORAL | 0 refills | Status: DC

## 2021-03-25 MED ORDER — THIAMINE HCL 100 MG PO TABS: 100.0000 mg | ORAL_TABLET | Freq: Every day | ORAL | 0 refills | Status: DC

## 2021-03-25 MED ORDER — POLYETHYLENE GLYCOL 3350 17 G PO PACK: 17.0000 g | PACK | Freq: Every day | ORAL | 0 refills | Status: DC | PRN

## 2021-03-25 MED ORDER — ALBUTEROL SULFATE HFA 108 (90 BASE) MCG/ACT IN AERS: 2.0000 | INHALATION_SPRAY | Freq: Four times a day (QID) | RESPIRATORY_TRACT | 1 refills | Status: AC | PRN

## 2021-03-25 MED ORDER — ACETAMINOPHEN 325 MG PO TABS: 650.0000 mg | ORAL_TABLET | Freq: Four times a day (QID) | ORAL | 0 refills | Status: DC | PRN

## 2021-03-25 MED ORDER — ADVAIR HFA 115-21 MCG/ACT IN AERO: 2.0000 | INHALATION_SPRAY | Freq: Two times a day (BID) | RESPIRATORY_TRACT | 2 refills | Status: AC

## 2021-03-25 MED ORDER — CYANOCOBALAMIN 1000 MCG PO TABS: 1000.0000 ug | ORAL_TABLET | Freq: Every day | ORAL | 2 refills | Status: DC

## 2021-03-25 MED ORDER — TRAMADOL HCL 50 MG PO TABS
50.0000 mg | ORAL_TABLET | Freq: Four times a day (QID) | ORAL | Status: DC | PRN
  Administered 2021-03-25: 100 mg via ORAL
  Filled 2021-03-25: qty 2

## 2021-03-25 MED ORDER — TRAMADOL HCL 50 MG PO TABS: 50.0000 mg | ORAL_TABLET | Freq: Four times a day (QID) | ORAL | 0 refills | Status: AC | PRN

## 2021-03-25 MED ORDER — GUAIFENESIN ER 600 MG PO TB12: 600.0000 mg | ORAL_TABLET | Freq: Two times a day (BID) | ORAL | 0 refills | Status: DC

## 2021-03-25 MED ORDER — MELOXICAM 15 MG PO TABS: 15.0000 mg | ORAL_TABLET | Freq: Two times a day (BID) | ORAL | 0 refills | Status: AC

## 2021-03-25 MED ORDER — IBUPROFEN 400 MG PO TABS: 600.0000 mg | ORAL_TABLET | Freq: Four times a day (QID) | ORAL | Status: DC | PRN

## 2021-03-25 MED ORDER — DOCUSATE SODIUM 100 MG PO CAPS: 100.0000 mg | ORAL_CAPSULE | Freq: Two times a day (BID) | ORAL | 0 refills | Status: DC

## 2021-03-25 MED ORDER — PREGABALIN 150 MG PO CAPS: 150.0000 mg | ORAL_CAPSULE | Freq: Three times a day (TID) | ORAL | 0 refills | Status: AC

## 2021-03-25 MED ORDER — FOLIC ACID 1 MG PO TABS: 1.0000 mg | ORAL_TABLET | Freq: Every day | ORAL | 0 refills | Status: DC

## 2021-03-25 MED ORDER — DULOXETINE HCL 30 MG PO CPEP: 90.0000 mg | ORAL_CAPSULE | Freq: Every day | ORAL | 3 refills | Status: AC

## 2021-03-25 MED ORDER — OLANZAPINE 5 MG PO TABS: 5.0000 mg | ORAL_TABLET | Freq: Every day | ORAL | 2 refills | Status: DC

## 2021-03-25 MED ORDER — OLANZAPINE 15 MG PO TABS: 15.0000 mg | ORAL_TABLET | Freq: Every day | ORAL | 2 refills | Status: DC
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
## 2021-03-25 NOTE — Care Management Important Message (Signed)
Important Message  Patient Details  Name: Andrew Wheeler MRN: [id number] Date of Birth: 1966/01/01   Medicare Important Message Given:  Yes - Important Message mailed due to current National Emergency   Verbal consent obtained due to current National Emergency  Relationship to patient: Self Contact Name: Gared Call Date: 03/25/21  Time: 1109 Phone: [phone] Outcome: No Answer/Busy Important Message mailed to: Patient address on file    [name] 03/25/2021, 11:09 AM

## 2021-03-25 NOTE — Care Management (Signed)
Provided cab voucher and bus pass.

## 2021-03-25 NOTE — Progress Notes (Signed)
Patient has ordered for discharge. Given discharge instructions with paper to the patient. Patient is alert and oriented x4. Given all belongings including safety deposit and medicine from the pharmacy.

## 2021-03-25 NOTE — Discharge Summary (Signed)
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 variability, suggesting right atrial pressure of 3 mmHg. IAS/Shunts: No atrial level shunt detected by color flow Doppler.  LEFT VENTRICLE PLAX 2D LVIDd:         4.10 cm  Diastology LVIDs:         2.80 cm  LV e' medial:    9.82 cm/s LV PW:         0.90 cm  LV E/e' medial:  9.2 LV IVS:        1.00 cm  LV e' lateral:   11.40 cm/s LVOT diam:     2.30 cm  LV E/e' lateral: 7.9 LV SV:         96 LV SV Index:   50       2D Longitudinal Strain LVOT Area:     4.15 cm 2D Strain GLS Avg:     -19.0 %                          3D Volume EF:                         3D EF:        58 %                         LV EDV:       157 ml  LV ESV:       66 ml                         LV SV:        91 ml RIGHT VENTRICLE RV S prime:     19.30 cm/s TAPSE (M-mode): 2.5 cm LEFT ATRIUM             Index       RIGHT ATRIUM           Index LA diam:        3.20 cm 1.65 cm/m  RA Area:     11.10 cm LA Vol (A2C):   45.1 ml 23.19 ml/m RA Volume:   20.50 ml  10.54 ml/m LA Vol (A4C):   46.2 ml 23.76 ml/m LA Biplane Vol: 46.0 ml 23.65 ml/m  AORTIC VALVE LVOT Vmax:   135.00 cm/s LVOT Vmean:  95.200 cm/s LVOT VTI:    0.232 m  AORTA Ao Root diam: 3.60 cm Ao Asc diam:  3.10 cm MITRAL  VALVE MV Area (PHT): 7.22 cm    SHUNTS MV Decel Time: 105 msec    Systemic VTI:  0.23 m MV E velocity: 90.00 cm/s  Systemic Diam: 2.30 cm MV A velocity: 64.00 cm/s MV E/A ratio:  1.41 Olga Millers MD Electronically signed by Olga Millers MD Signature Date/Time: 03/15/2021/3:34:09 PM    Final    DG ESOPHAGUS W SINGLE CM (SOL OR THIN BA)  Result Date: 03/17/2021 CLINICAL DATA:  GERD, pill stuck on swallow function that was performed today. EXAM: ESOPHOGRAM/BARIUM SWALLOW TECHNIQUE: Single contrast examination was performed using  thin barium. FLUOROSCOPY TIME:  Fluoroscopy Time:  42 seconds Radiation Exposure Index (if provided by the fluoroscopic device): 5.1 mGy Number of Acquired Spot Images: 0 COMPARISON:  Swallow function study performed same day. FINDINGS: Swallowing was performed with thin barium showing no gross swallow dysfunction. Esophagus without signs of stricture grossly. Limited assessment as the patient could only be position in the LPO and AP projection due to injuries. No gross hiatal hernia. Motility not well assessed due to patient positioning and limitations described above. IMPRESSION: Limited evaluation without signs of gross dysmotility, esophageal stricture or gross hiatal hernia. If there are symptoms that would suggest stricture such as globus sensation repeat evaluation could be performed when the patient is better able to stand and roll to RIGHT-side and or lay in the prone position for full esophageal assessment. Electronically Signed   By: Donzetta Kohut M.D.   On: 03/17/2021 15:04     Subjective: Complaint of pain. Asking for vicodin. Explain to him tramadol should help with pain.   Discharge Exam: Vitals:   03/25/21 0747 03/25/21 1128  BP:  (!) 130/93  Pulse:  100  Resp:  18  Temp:  99.3 F (37.4 C)  SpO2: 94% 95%     General: Pt is alert, awake, not in acute distress Cardiovascular: RRR, S1/S2 +, no rubs, no gallops Respiratory: CTA bilaterally, no  wheezing, no rhonchi Abdominal: Soft, NT, ND, bowel sounds + Extremities: no edema, no cyanosis    The results of significant diagnostics from this hospitalization (including imaging, microbiology, ancillary and laboratory) are listed below for reference.     Microbiology: No results found for this or any previous visit (from the past 240 hour(s)).   Labs: BNP (last 3 results) No results for input(s): BNP in the last 8760 hours. Basic Metabolic Panel: Recent Labs  Lab 03/19/21 0034 03/20/21 0109 03/23/21 0404  NA 133* 140 139  K 3.3* 4.2 3.7  CL 97* 108 107  CO2 GLUCOSE 136* 161* 165*  BUN CREATININE 0.67 0.63 0.68  CALCIUM 8.5* 8.4* 8.9  MG  --  1.9  --    Liver Function Tests: No results for input(s): AST, ALT, ALKPHOS, BILITOT, PROT, ALBUMIN in the last 168 hours. No results for input(s): LIPASE, AMYLASE in the last 168 hours. No results for input(s): AMMONIA in the last 168 hours. CBC: No results for input(s): WBC, NEUTROABS, HGB, HCT, MCV, PLT in the last 168 hours. Cardiac Enzymes: No results for input(s): CKTOTAL, CKMB, CKMBINDEX, TROPONINI in the last 168 hours. BNP: Invalid input(s): POCBNP CBG: Recent Labs  Lab 03/19/21 1521 03/19/21 1928 03/19/21 2324 03/20/21 0336 03/20/21 0743  GLUCAP 190* 165* 199* 157* 134*   D-Dimer No results for input(s): DDIMER in the last 72 hours. Hgb A1c No results for input(s): HGBA1C in the last 72 hours. Lipid Profile No results for input(s): CHOL, HDL, LDLCALC, TRIG, CHOLHDL, LDLDIRECT in the last 72 hours. Thyroid function studies Recent Labs    03/23/21 0755  TSH 1.250   Anemia work up Recent Labs    03/23/21 0755  VITAMINB12 235   Urinalysis    Component Value Date/Time   COLORURINE YELLOW 03/14/2021 0001   APPEARANCEUR CLOUDY (A) 03/14/2021 0001   LABSPEC 1.015 03/14/2021 0001   PHURINE 5.0 03/14/2021 0001   GLUCOSEU >=500 (A) 03/14/2021 0001   HGBUR MODERATE (A) 03/14/2021  0001   BILIRUBINUR NEGATIVE 03/14/2021 0001   KETONESUR NEGATIVE 03/14/2021 0001   PROTEINUR 100 (A) 03/14/2021 0001   NITRITE NEGATIVE 03/14/2021 0001   LEUKOCYTESUR NEGATIVE 03/14/2021 0001   Sepsis Labs Invalid input(s): PROCALCITONIN,  WBC,  LACTICIDVEN Microbiology No results found for this or any previous visit (from the past 240 hour(s)).   Time coordinating discharge: 40 minutes  SIGNED:   Alba Cory, MD  [facility]

## 2021-03-30 ENCOUNTER — Inpatient Hospital Stay (HOSPITAL_COMMUNITY)
Admission: EM | Admit: 2021-03-30 | Discharge: 2021-04-27 | DRG: 480 | Disposition: A | Discharge status: Home / Self Care | Payer: Medicare HMO | Attending: Internal Medicine | Admitting: Internal Medicine

## 2021-03-30 ENCOUNTER — Encounter (HOSPITAL_COMMUNITY): Payer: Self-pay | Admitting: Emergency Medicine

## 2021-03-30 ENCOUNTER — Other Ambulatory Visit: Payer: Self-pay

## 2021-03-30 ENCOUNTER — Emergency Department (HOSPITAL_COMMUNITY): Payer: Medicare HMO

## 2021-03-30 DIAGNOSIS — Z419 Encounter for procedure for purposes other than remedying health state, unspecified: Secondary | ICD-10-CM

## 2021-03-30 DIAGNOSIS — S72002D Fracture of unspecified part of neck of left femur, subsequent encounter for closed fracture with routine healing: Secondary | ICD-10-CM | POA: Diagnosis not present

## 2021-03-30 DIAGNOSIS — J449 Chronic obstructive pulmonary disease, unspecified: Secondary | ICD-10-CM | POA: Diagnosis present

## 2021-03-30 DIAGNOSIS — F411 Generalized anxiety disorder: Secondary | ICD-10-CM | POA: Diagnosis present

## 2021-03-30 DIAGNOSIS — S2243XS Multiple fractures of ribs, bilateral, sequela: Secondary | ICD-10-CM

## 2021-03-30 DIAGNOSIS — R059 Cough, unspecified: Secondary | ICD-10-CM

## 2021-03-30 DIAGNOSIS — U071 COVID-19: Secondary | ICD-10-CM

## 2021-03-30 DIAGNOSIS — T1490XA Injury, unspecified, initial encounter: Secondary | ICD-10-CM

## 2021-03-30 DIAGNOSIS — F32A Depression, unspecified: Secondary | ICD-10-CM | POA: Diagnosis present

## 2021-03-30 DIAGNOSIS — T148XXA Other injury of unspecified body region, initial encounter: Secondary | ICD-10-CM

## 2021-03-30 DIAGNOSIS — J431 Panlobular emphysema: Secondary | ICD-10-CM

## 2021-03-30 DIAGNOSIS — S2249XA Multiple fractures of ribs, unspecified side, initial encounter for closed fracture: Secondary | ICD-10-CM | POA: Diagnosis present

## 2021-03-30 DIAGNOSIS — S72002A Fracture of unspecified part of neck of left femur, initial encounter for closed fracture: Secondary | ICD-10-CM | POA: Diagnosis not present

## 2021-03-30 DIAGNOSIS — M25552 Pain in left hip: Secondary | ICD-10-CM

## 2021-03-30 DIAGNOSIS — K219 Gastro-esophageal reflux disease without esophagitis: Secondary | ICD-10-CM | POA: Diagnosis present

## 2021-03-30 DIAGNOSIS — F1110 Opioid abuse, uncomplicated: Secondary | ICD-10-CM | POA: Diagnosis present

## 2021-03-30 DIAGNOSIS — Z8674 Personal history of sudden cardiac arrest: Secondary | ICD-10-CM | POA: Diagnosis not present

## 2021-03-30 DIAGNOSIS — Y92410 Unspecified street and highway as the place of occurrence of the external cause: Secondary | ICD-10-CM | POA: Diagnosis not present

## 2021-03-30 DIAGNOSIS — F1910 Other psychoactive substance abuse, uncomplicated: Secondary | ICD-10-CM | POA: Diagnosis present

## 2021-03-30 DIAGNOSIS — D649 Anemia, unspecified: Secondary | ICD-10-CM | POA: Diagnosis present

## 2021-03-30 DIAGNOSIS — Z560 Unemployment, unspecified: Secondary | ICD-10-CM | POA: Diagnosis not present

## 2021-03-30 DIAGNOSIS — F319 Bipolar disorder, unspecified: Secondary | ICD-10-CM | POA: Diagnosis present

## 2021-03-30 DIAGNOSIS — S72115A Nondisplaced fracture of greater trochanter of left femur, initial encounter for closed fracture: Secondary | ICD-10-CM | POA: Diagnosis present

## 2021-03-30 DIAGNOSIS — S72145A Nondisplaced intertrochanteric fracture of left femur, initial encounter for closed fracture: Secondary | ICD-10-CM | POA: Diagnosis present

## 2021-03-30 DIAGNOSIS — S2241XD Multiple fractures of ribs, right side, subsequent encounter for fracture with routine healing: Secondary | ICD-10-CM | POA: Diagnosis not present

## 2021-03-30 DIAGNOSIS — S2220XD Unspecified fracture of sternum, subsequent encounter for fracture with routine healing: Secondary | ICD-10-CM

## 2021-03-30 DIAGNOSIS — Z5900 Homelessness unspecified: Secondary | ICD-10-CM | POA: Diagnosis not present

## 2021-03-30 DIAGNOSIS — F4310 Post-traumatic stress disorder, unspecified: Secondary | ICD-10-CM | POA: Diagnosis present

## 2021-03-30 DIAGNOSIS — F17210 Nicotine dependence, cigarettes, uncomplicated: Secondary | ICD-10-CM | POA: Diagnosis present

## 2021-03-30 DIAGNOSIS — F1010 Alcohol abuse, uncomplicated: Secondary | ICD-10-CM | POA: Diagnosis present

## 2021-03-30 DIAGNOSIS — Z765 Malingerer [conscious simulation]: Secondary | ICD-10-CM | POA: Diagnosis not present

## 2021-03-30 DIAGNOSIS — J42 Unspecified chronic bronchitis: Secondary | ICD-10-CM | POA: Diagnosis not present

## 2021-03-30 HISTORY — DX: Chronic obstructive pulmonary disease, unspecified: J44.9

## 2021-03-30 HISTORY — DX: Post-traumatic stress disorder, unspecified: F43.10

## 2021-03-30 HISTORY — DX: Alcohol abuse, uncomplicated: F10.10

## 2021-03-30 HISTORY — DX: Depression, unspecified: F32.A

## 2021-03-30 HISTORY — DX: Generalized anxiety disorder: F41.1

## 2021-03-30 HISTORY — DX: Gastro-esophageal reflux disease without esophagitis: K21.9

## 2021-03-30 LAB — COMPREHENSIVE METABOLIC PANEL
ALT: 14 U/L (ref 0–44)
AST: 14 U/L — ABNORMAL LOW (ref 15–41)
Albumin: 3.1 g/dL — ABNORMAL LOW (ref 3.5–5.0)
Alkaline Phosphatase: 98 U/L (ref 38–126)
Anion gap: 8 (ref 5–15)
BUN: 12 mg/dL (ref 6–20)
CO2: 24 mmol/L (ref 22–32)
Calcium: 8.6 mg/dL — ABNORMAL LOW (ref 8.9–10.3)
Chloride: 108 mmol/L (ref 98–111)
Creatinine, Ser: 0.58 mg/dL — ABNORMAL LOW (ref 0.61–1.24)
GFR, Estimated: >60 mL/min (ref >60)
Glucose, Bld: 113 mg/dL — ABNORMAL HIGH (ref 70–99)
Potassium: 4 mmol/L (ref 3.5–5.1)
Sodium: 140 mmol/L (ref 135–145)
Total Bilirubin: 0.4 mg/dL (ref 0.3–1.2)
Total Protein: 5.6 g/dL — ABNORMAL LOW (ref 6.5–8.1)

## 2021-03-30 LAB — CBC WITH DIFFERENTIAL/PLATELET
Abs Immature Granulocytes: 0.1 10*3/uL — ABNORMAL HIGH (ref 0.00–0.07)
Basophils Absolute: 0.1 10*3/uL (ref 0.0–0.1)
Basophils Relative: 1 %
Eosinophils Absolute: 0.3 10*3/uL (ref 0.0–0.5)
Eosinophils Relative: 4 %
HCT: 35 % — ABNORMAL LOW (ref 39.0–52.0)
Hemoglobin: 11.4 g/dL — ABNORMAL LOW (ref 13.0–17.0)
Immature Granulocytes: 2 %
Lymphocytes Relative: 30 %
Lymphs Abs: 1.9 10*3/uL (ref 0.7–4.0)
MCH: 31.6 pg (ref 26.0–34.0)
MCHC: 32.6 g/dL (ref 30.0–36.0)
MCV: 97 fL (ref 80.0–100.0)
Monocytes Absolute: 0.5 10*3/uL (ref 0.1–1.0)
Monocytes Relative: 7 %
Neutro Abs: 3.7 10*3/uL (ref 1.7–7.7)
Neutrophils Relative %: 56 %
Platelets: 447 10*3/uL — ABNORMAL HIGH (ref 150–400)
RBC: 3.61 MIL/uL — ABNORMAL LOW (ref 4.22–5.81)
RDW: 13.3 % (ref 11.5–15.5)
WBC: 6.5 10*3/uL (ref 4.0–10.5)
nRBC: 0 % (ref 0.0–0.2)

## 2021-03-30 LAB — I-STAT CHEM 8, ED
BUN: 12 mg/dL (ref 6–20)
Calcium, Ion: 0.95 mmol/L — ABNORMAL LOW (ref 1.15–1.40)
Chloride: 108 mmol/L (ref 98–111)
Creatinine, Ser: 0.6 mg/dL — ABNORMAL LOW (ref 0.61–1.24)
Glucose, Bld: 110 mg/dL — ABNORMAL HIGH (ref 70–99)
HCT: 33 % — ABNORMAL LOW (ref 39.0–52.0)
Hemoglobin: 11.2 g/dL — ABNORMAL LOW (ref 13.0–17.0)
Potassium: 4 mmol/L (ref 3.5–5.1)
Sodium: 140 mmol/L (ref 135–145)
TCO2: 23 mmol/L (ref 22–32)

## 2021-03-30 MED ORDER — ONDANSETRON HCL 4 MG/2ML IJ SOLN: 4.0000 mg | Freq: Four times a day (QID) | INTRAMUSCULAR | Status: DC | PRN

## 2021-03-30 MED ORDER — FENTANYL CITRATE (PF) 100 MCG/2ML IJ SOLN: 50.0000 ug | INTRAMUSCULAR | Status: DC | PRN

## 2021-03-30 MED ORDER — IOHEXOL 300 MG/ML  SOLN
100.0000 mL | Freq: Once | INTRAMUSCULAR | Status: AC | PRN
  Administered 2021-03-30: 100 mL via INTRAVENOUS

## 2021-03-30 MED ORDER — HYDROMORPHONE HCL 1 MG/ML IJ SOLN
1.0000 mg | Freq: Once | INTRAMUSCULAR | Status: AC
  Administered 2021-03-30: 1 mg via INTRAVENOUS
  Filled 2021-03-30: qty 1

## 2021-03-30 MED ORDER — SODIUM CHLORIDE 0.9 % IV BOLUS
1000.0000 mL | Freq: Once | INTRAVENOUS | Status: AC
  Administered 2021-03-30: 1000 mL via INTRAVENOUS

## 2021-03-30 MED ORDER — ACETAMINOPHEN 325 MG PO TABS
650.0000 mg | ORAL_TABLET | Freq: Four times a day (QID) | ORAL | Status: DC | PRN
  Administered 2021-04-02 (×2): 650 mg via ORAL
  Filled 2021-03-30 (×2): qty 2

## 2021-03-30 MED ORDER — FENTANYL CITRATE (PF) 100 MCG/2ML IJ SOLN
75.0000 ug | Freq: Once | INTRAMUSCULAR | Status: AC
  Administered 2021-03-30: 75 ug via INTRAVENOUS
  Filled 2021-03-30: qty 2

## 2021-03-30 MED ORDER — ACETAMINOPHEN 650 MG RE SUPP: 650.0000 mg | Freq: Four times a day (QID) | RECTAL | Status: DC | PRN

## 2021-03-30 MED ORDER — NALOXONE HCL 0.4 MG/ML IJ SOLN: 0.4000 mg | INTRAMUSCULAR | Status: DC | PRN
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
## 2021-03-30 NOTE — ED Notes (Signed)
Pt in CT, on monitor with TRN
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
## 2021-03-30 NOTE — Progress Notes (Signed)
   03/30/21 2150  Clinical Encounter Type  Visited With Patient not available  Visit Type Trauma  Referral From Nurse  Consult/Referral To Chaplain    Chaplain responded to page. The patient was being attended to by the medical team. There was no support person present. This chaplain remains available.This note was prepared by Deneen Harts, M.Div..  For questions please contact by phone [phone].
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
## 2021-03-30 NOTE — ED Notes (Signed)
Patient transported to CT 

## 2021-03-31 ENCOUNTER — Encounter (HOSPITAL_COMMUNITY): Payer: Self-pay | Admitting: Internal Medicine

## 2021-03-31 ENCOUNTER — Inpatient Hospital Stay (HOSPITAL_COMMUNITY): Payer: Medicare HMO

## 2021-03-31 DIAGNOSIS — K219 Gastro-esophageal reflux disease without esophagitis: Secondary | ICD-10-CM | POA: Diagnosis present

## 2021-03-31 DIAGNOSIS — U071 COVID-19: Secondary | ICD-10-CM | POA: Diagnosis not present

## 2021-03-31 DIAGNOSIS — S72002A Fracture of unspecified part of neck of left femur, initial encounter for closed fracture: Secondary | ICD-10-CM | POA: Diagnosis not present

## 2021-03-31 DIAGNOSIS — F32A Depression, unspecified: Secondary | ICD-10-CM | POA: Diagnosis present

## 2021-03-31 DIAGNOSIS — J431 Panlobular emphysema: Secondary | ICD-10-CM | POA: Diagnosis not present

## 2021-03-31 DIAGNOSIS — F411 Generalized anxiety disorder: Secondary | ICD-10-CM | POA: Diagnosis present

## 2021-03-31 DIAGNOSIS — S2249XA Multiple fractures of ribs, unspecified side, initial encounter for closed fracture: Secondary | ICD-10-CM | POA: Diagnosis present

## 2021-03-31 DIAGNOSIS — J449 Chronic obstructive pulmonary disease, unspecified: Secondary | ICD-10-CM | POA: Diagnosis present

## 2021-03-31 DIAGNOSIS — M25552 Pain in left hip: Secondary | ICD-10-CM | POA: Insufficient documentation

## 2021-03-31 LAB — PROTIME-INR
INR: 1 (ref 0.8–1.2)
Prothrombin Time: 13.1 seconds (ref 11.4–15.2)

## 2021-03-31 LAB — COMPREHENSIVE METABOLIC PANEL
ALT: 12 U/L (ref 0–44)
AST: 18 U/L (ref 15–41)
Albumin: 2.8 g/dL — ABNORMAL LOW (ref 3.5–5.0)
Alkaline Phosphatase: 99 U/L (ref 38–126)
Anion gap: 9 (ref 5–15)
BUN: 10 mg/dL (ref 6–20)
CO2: 22 mmol/L (ref 22–32)
Calcium: 8.2 mg/dL — ABNORMAL LOW (ref 8.9–10.3)
Chloride: 107 mmol/L (ref 98–111)
Creatinine, Ser: 0.65 mg/dL (ref 0.61–1.24)
GFR, Estimated: >60 mL/min (ref >60)
Glucose, Bld: 131 mg/dL — ABNORMAL HIGH (ref 70–99)
Potassium: 4.3 mmol/L (ref 3.5–5.1)
Sodium: 138 mmol/L (ref 135–145)
Total Bilirubin: 0.3 mg/dL (ref 0.3–1.2)
Total Protein: 5.2 g/dL — ABNORMAL LOW (ref 6.5–8.1)

## 2021-03-31 LAB — RESP PANEL BY RT-PCR (FLU A&B, COVID) ARPGX2
Influenza A by PCR: NEGATIVE
Influenza B by PCR: NEGATIVE
SARS Coronavirus 2 by RT PCR: POSITIVE — AB

## 2021-03-31 LAB — CBC
HCT: 32.5 % — ABNORMAL LOW (ref 39.0–52.0)
Hemoglobin: 10.8 g/dL — ABNORMAL LOW (ref 13.0–17.0)
MCH: 31.6 pg (ref 26.0–34.0)
MCHC: 33.2 g/dL (ref 30.0–36.0)
MCV: 95 fL (ref 80.0–100.0)
Platelets: 415 10*3/uL — ABNORMAL HIGH (ref 150–400)
RBC: 3.42 MIL/uL — ABNORMAL LOW (ref 4.22–5.81)
RDW: 13.4 % (ref 11.5–15.5)
WBC: 9.2 10*3/uL (ref 4.0–10.5)
nRBC: 0 % (ref 0.0–0.2)

## 2021-03-31 LAB — HIV ANTIBODY (ROUTINE TESTING W REFLEX): HIV Screen 4th Generation wRfx: NONREACTIVE

## 2021-03-31 LAB — MAGNESIUM: Magnesium: 1.6 mg/dL — ABNORMAL LOW (ref 1.7–2.4)

## 2021-03-31 LAB — PHOSPHORUS: Phosphorus: 4 mg/dL (ref 2.5–4.6)

## 2021-03-31 MED ORDER — DULOXETINE HCL 60 MG PO CPEP
60.0000 mg | ORAL_CAPSULE | Freq: Two times a day (BID) | ORAL | Status: DC
  Administered 2021-03-31 – 2021-04-27 (×54): 60 mg via ORAL
  Filled 2021-03-31 (×54): qty 1

## 2021-03-31 MED ORDER — MAGNESIUM SULFATE 2 GM/50ML IV SOLN
2.0000 g | Freq: Once | INTRAVENOUS | Status: AC
  Administered 2021-03-31: 2 g via INTRAVENOUS
  Filled 2021-03-31: qty 50

## 2021-03-31 MED ORDER — PREGABALIN 75 MG PO CAPS
150.0000 mg | ORAL_CAPSULE | Freq: Three times a day (TID) | ORAL | Status: DC
  Administered 2021-03-31 – 2021-04-20 (×60): 150 mg via ORAL
  Filled 2021-03-31 (×60): qty 2

## 2021-03-31 MED ORDER — HYDROMORPHONE HCL 1 MG/ML IJ SOLN
0.5000 mg | INTRAMUSCULAR | Status: DC | PRN
  Administered 2021-03-31 – 2021-04-01 (×10): 0.5 mg via INTRAVENOUS
  Filled 2021-03-31 (×2): qty 0.5
  Filled 2021-03-31: qty 1
  Filled 2021-03-31 (×2): qty 0.5
  Filled 2021-03-31 (×2): qty 1
  Filled 2021-03-31: qty 0.5
  Filled 2021-03-31 (×2): qty 1

## 2021-03-31 MED ORDER — NICOTINE 21 MG/24HR TD PT24
21.0000 mg | MEDICATED_PATCH | Freq: Every day | TRANSDERMAL | Status: DC | PRN
  Administered 2021-03-31: 21 mg via TRANSDERMAL
  Filled 2021-03-31: qty 1

## 2021-03-31 MED ORDER — OXYCODONE HCL 5 MG PO TABS
5.0000 mg | ORAL_TABLET | ORAL | Status: DC | PRN
  Administered 2021-03-31 – 2021-04-01 (×5): 10 mg via ORAL
  Filled 2021-03-31 (×5): qty 2

## 2021-03-31 MED ORDER — MOMETASONE FURO-FORMOTEROL FUM 200-5 MCG/ACT IN AERO
2.0000 | INHALATION_SPRAY | Freq: Two times a day (BID) | RESPIRATORY_TRACT | Status: DC
  Administered 2021-04-01 – 2021-04-27 (×46): 2 via RESPIRATORY_TRACT
  Filled 2021-03-31 (×2): qty 8.8

## 2021-03-31 MED ORDER — BUSPIRONE HCL 5 MG PO TABS
15.0000 mg | ORAL_TABLET | Freq: Three times a day (TID) | ORAL | Status: DC
  Administered 2021-03-31 – 2021-04-27 (×80): 15 mg via ORAL
  Filled 2021-03-31 (×80): qty 1

## 2021-03-31 MED ORDER — BUPROPION HCL ER (XL) 300 MG PO TB24
300.0000 mg | ORAL_TABLET | Freq: Every day | ORAL | Status: DC
  Administered 2021-03-31 – 2021-04-27 (×28): 300 mg via ORAL
  Filled 2021-03-31 (×28): qty 1

## 2021-03-31 MED ORDER — PANTOPRAZOLE SODIUM 40 MG PO TBEC
40.0000 mg | DELAYED_RELEASE_TABLET | Freq: Every day | ORAL | Status: DC
  Administered 2021-03-31 – 2021-04-27 (×28): 40 mg via ORAL
  Filled 2021-03-31 (×28): qty 1

## 2021-03-31 MED ORDER — ASPIRIN EC 81 MG PO TBEC
81.0000 mg | DELAYED_RELEASE_TABLET | Freq: Every day | ORAL | Status: DC
  Administered 2021-03-31 – 2021-04-27 (×28): 81 mg via ORAL
  Filled 2021-03-31 (×28): qty 1

## 2021-03-31 MED ORDER — MOMETASONE FURO-FORMOTEROL FUM 200-5 MCG/ACT IN AERO
2.0000 | INHALATION_SPRAY | Freq: Two times a day (BID) | RESPIRATORY_TRACT | Status: DC
  Filled 2021-03-31: qty 8.8

## 2021-03-31 MED ORDER — ALBUTEROL SULFATE (2.5 MG/3ML) 0.083% IN NEBU
3.0000 mL | INHALATION_SOLUTION | RESPIRATORY_TRACT | Status: DC | PRN
Start: 2021-03-31 — End: 2021-04-27

## 2021-03-31 NOTE — ED Notes (Signed)
Ortho MD on call at bedside

## 2021-03-31 NOTE — Progress Notes (Signed)
TOC CSW sent pt a referral to [facility] for review.  Pt is to follow up with an assessment at # [phone].  Derin Granquist Tarpley-Carter, MSW, LCSW-A Pronouns:  She/Her/Hers                          [place] Clinical Social WorkerTransitions of Care Cell:  [phone] [email or web address]
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
## 2021-03-31 NOTE — ED Notes (Signed)
Arnell Sieving mother [phone] would like an update

## 2021-03-31 NOTE — TOC CAGE-AID Note (Signed)
Transition of Care [facility]) - CAGE-AID Screening   Patient Details  Name: Obediah Welles MRN: [id number] Date of Birth: 1965-11-16  Transition of Care [facility]) CM/SW Contact:    Finian Helvey C Tarpley-Carter, LCSWA Phone Number: 03/31/2021, 12:58 PM   Clinical Narrative: Pt did participate in Cage-Aid.  Pt did ask for resources for assistance.  Chandel Zaun Tarpley-Carter, MSW, LCSW-A Pronouns:  She/Her/Hers                          [place] Clinical Social WorkerTransitions of Care Cell:  [phone] [email or web address]    CAGE-AID Screening:    Have You Ever Felt You Ought to Cut Down on Your Drinking or Drug Use?: Yes Have People Annoyed You By [employer] Your Drinking Or Drug Use?: Yes Have You Felt Bad Or Guilty About Your Drinking Or Drug Use?: Yes Have You Ever Had a Drink or Used Drugs First Thing In The Morning to Steady Your Nerves or to Get Rid of a Hangover?: Yes CAGE-AID Score: 4  Substance Abuse Education Offered: Yes  Substance abuse interventions: Transport planner

## 2021-03-31 NOTE — Plan of Care (Signed)
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
## 2021-04-01 DIAGNOSIS — S2243XS Multiple fractures of ribs, bilateral, sequela: Secondary | ICD-10-CM | POA: Diagnosis not present

## 2021-04-01 DIAGNOSIS — J431 Panlobular emphysema: Secondary | ICD-10-CM | POA: Diagnosis not present

## 2021-04-01 DIAGNOSIS — S72002D Fracture of unspecified part of neck of left femur, subsequent encounter for closed fracture with routine healing: Secondary | ICD-10-CM | POA: Diagnosis not present

## 2021-04-01 LAB — CBC
HCT: 37.6 % — ABNORMAL LOW (ref 39.0–52.0)
Hemoglobin: 12.7 g/dL — ABNORMAL LOW (ref 13.0–17.0)
MCH: 31.6 pg (ref 26.0–34.0)
MCHC: 33.8 g/dL (ref 30.0–36.0)
MCV: 93.5 fL (ref 80.0–100.0)
Platelets: 360 10*3/uL (ref 150–400)
RBC: 4.02 MIL/uL — ABNORMAL LOW (ref 4.22–5.81)
RDW: 13.1 % (ref 11.5–15.5)
WBC: 8.1 10*3/uL (ref 4.0–10.5)
nRBC: 0 % (ref 0.0–0.2)

## 2021-04-01 LAB — COMPREHENSIVE METABOLIC PANEL
ALT: 13 U/L (ref 0–44)
AST: 13 U/L — ABNORMAL LOW (ref 15–41)
Albumin: 2.8 g/dL — ABNORMAL LOW (ref 3.5–5.0)
Alkaline Phosphatase: 107 U/L (ref 38–126)
Anion gap: 3 — ABNORMAL LOW (ref 5–15)
BUN: 8 mg/dL (ref 6–20)
CO2: 30 mmol/L (ref 22–32)
Calcium: 8.6 mg/dL — ABNORMAL LOW (ref 8.9–10.3)
Chloride: 103 mmol/L (ref 98–111)
Creatinine, Ser: 0.53 mg/dL — ABNORMAL LOW (ref 0.61–1.24)
GFR, Estimated: >60 mL/min (ref >60)
Glucose, Bld: 94 mg/dL (ref 70–99)
Potassium: 4.5 mmol/L (ref 3.5–5.1)
Sodium: 136 mmol/L (ref 135–145)
Total Bilirubin: 0.4 mg/dL (ref 0.3–1.2)
Total Protein: 5.5 g/dL — ABNORMAL LOW (ref 6.5–8.1)

## 2021-04-01 LAB — C-REACTIVE PROTEIN: CRP: 1.7 mg/dL — ABNORMAL HIGH (ref <1.0)

## 2021-04-01 LAB — MAGNESIUM: Magnesium: 1.7 mg/dL (ref 1.7–2.4)

## 2021-04-01 LAB — D-DIMER, QUANTITATIVE: D-Dimer, Quant: 0.74 ug/mL-FEU — ABNORMAL HIGH (ref 0.00–0.50)

## 2021-04-01 LAB — PHOSPHORUS: Phosphorus: 4.7 mg/dL — ABNORMAL HIGH (ref 2.5–4.6)

## 2021-04-01 MED ORDER — HYDROMORPHONE HCL 1 MG/ML IJ SOLN
1.0000 mg | INTRAMUSCULAR | Status: DC | PRN
  Administered 2021-04-01 – 2021-04-03 (×10): 1 mg via INTRAVENOUS
  Filled 2021-04-01 (×10): qty 1

## 2021-04-01 MED ORDER — OXYCODONE HCL 5 MG PO TABS
15.0000 mg | ORAL_TABLET | ORAL | Status: DC | PRN
  Administered 2021-04-01 – 2021-04-10 (×27): 15 mg via ORAL
  Filled 2021-04-01 (×27): qty 3

## 2021-04-01 MED ORDER — NICOTINE 14 MG/24HR TD PT24
14.0000 mg | MEDICATED_PATCH | Freq: Every day | TRANSDERMAL | Status: DC
  Administered 2021-04-01 – 2021-04-27 (×27): 14 mg via TRANSDERMAL
  Filled 2021-04-01 (×27): qty 1

## 2021-04-01 NOTE — NC FL2 (Signed)
[place] MEDICAID FL2 LEVEL OF CARE SCREENING TOOL     IDENTIFICATION  Patient Name: Andrew Wheeler Birthdate: 1965/10/21 Sex: male Admission Date (Current Location): 03/30/2021  [facility] and [place] Number:  Producer, television/film/video and Address:  [facility], [address], [place], [place] [address]      Provider Number: [id number]  Attending Physician Name and Address:  Leatha Gilding, MD  Relative Name and Phone Number:  Chrystie Nose (Mother)   [phone] [facility])    Current Level of Care: Hospital Recommended Level of Care: Skilled Nursing Facility Prior Approval Number:    Date Approved/Denied:   PASRR Number: Pending  Discharge Plan: SNF    Current Diagnoses: Patient Active Problem List   Diagnosis Date Noted   Left hip pain 03/31/2021   Rib fractures 03/31/2021   COPD (chronic obstructive pulmonary disease) (HCC)    GAD (generalized anxiety disorder)    GERD (gastroesophageal reflux disease)    Depression    Closed left hip fracture (HCC) 03/30/2021    Orientation RESPIRATION BLADDER Height & Weight     Self, Time, Situation, Place  Normal Continent Weight: 165 lb (74.8 kg) Height:  6' (182.9 cm)  BEHAVIORAL SYMPTOMS/MOOD NEUROLOGICAL BOWEL NUTRITION STATUS      Continent Diet (See d/c summary)  AMBULATORY STATUS COMMUNICATION OF NEEDS Skin   Extensive Assist Verbally Normal                       Personal Care Assistance Level of Assistance  Bathing, Feeding, Dressing Bathing Assistance: Maximum assistance Feeding assistance: Independent Dressing Assistance: Maximum assistance     Functional Limitations Info  Sight, Hearing, Speech Sight Info: Adequate Hearing Info: Adequate Speech Info: Adequate    SPECIAL CARE FACTORS FREQUENCY  PT (By licensed PT), OT (By licensed OT)     PT Frequency: 5x/week OT Frequency: 5x/week            Contractures Contractures Info: Not present    Additional Factors Info   Code Status, Allergies Code Status Info: Full code Allergies Info: No Known allergies           Current Medications (04/01/2021):  This is the current hospital active medication list Current Facility-Administered Medications  Medication Dose Route Frequency Provider Last Rate Last Admin   acetaminophen (TYLENOL) tablet 650 mg  650 mg Oral Q6H PRN Howerter, Justin B, DO       Or   acetaminophen (TYLENOL) suppository 650 mg  650 mg Rectal Q6H PRN Howerter, Justin B, DO       albuterol (PROVENTIL) (2.5 MG/3ML) 0.083% nebulizer solution 3 mL  3 mL Inhalation Q4H PRN Howerter, Justin B, DO       aspirin EC tablet 81 mg  81 mg Oral Daily Cammy Copa, MD   81 mg at 04/01/21 0926   buPROPion (WELLBUTRIN XL) 24 hr tablet 300 mg  300 mg Oral Daily Leatha Gilding, MD   300 mg at 04/01/21 0927   busPIRone (BUSPAR) tablet 15 mg  15 mg Oral TID Leatha Gilding, MD   15 mg at 04/01/21 0926   DULoxetine (CYMBALTA) DR capsule 60 mg  60 mg Oral BID Leatha Gilding, MD   60 mg at 04/01/21 1694   HYDROmorphone (DILAUDID) injection 1 mg  1 mg Intravenous Q3H PRN Leatha Gilding, MD   1 mg at 04/01/21 1347   mometasone-formoterol (DULERA) 200-5 MCG/ACT inhaler 2 puff  2  puff Inhalation BID Howerter, Justin B, DO   2 puff at 04/01/21 0854   naloxone [facility]) injection 0.4 mg  0.4 mg Intravenous PRN Howerter, Justin B, DO       nicotine (NICODERM CQ - dosed in mg/24 hours) patch 21 mg  21 mg Transdermal Daily PRN Howerter, Justin B, DO   21 mg at 03/31/21 0601   ondansetron (ZOFRAN) injection 4 mg  4 mg Intravenous Q6H PRN Howerter, Justin B, DO       oxyCODONE (Oxy IR/ROXICODONE) immediate release tablet 15 mg  15 mg Oral Q4H PRN Leatha Gilding, MD       pantoprazole (PROTONIX) EC tablet 40 mg  40 mg Oral Daily Leatha Gilding, MD   40 mg at 04/01/21 0926   pregabalin (LYRICA) capsule 150 mg  150 mg Oral TID Leatha Gilding, MD   150 mg at 04/01/21 7026     Discharge  Medications: Please see discharge summary for a list of discharge medications.  Relevant Imaging Results:  Relevant Lab Results:   Additional Information SSN [id number] Pt is vaccinated with 1 booster  Rosalie Gelpi Aris Lot, LCSW

## 2021-04-01 NOTE — Progress Notes (Signed)
TOC CSW contacted pt to provide him with [facility]'s # to complete assessment.  Pt stated he would call [facility] to complete assessment.  Aragon Scarantino Tarpley-Carter, MSW, LCSW-A Pronouns:  She/Her/Hers                          [place] Clinical Social WorkerTransitions of Care Cell:  [phone] [email or web address]
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
## 2021-04-01 NOTE — Progress Notes (Signed)
       RE:  Andrew Wheeler    Date of Birth:  21-May-1966   Date:  04/01/2021     To Whom It May Concern:  Please be advised that the above-named patient will require a short-term nursing home stay - anticipated 30 days or less for rehabilitation and strengthening.  The plan is for return home.
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
## 2021-04-02 ENCOUNTER — Inpatient Hospital Stay (HOSPITAL_COMMUNITY): Payer: Medicare HMO

## 2021-04-02 ENCOUNTER — Other Ambulatory Visit: Payer: Self-pay | Admitting: Orthopedic Surgery

## 2021-04-02 DIAGNOSIS — S2243XS Multiple fractures of ribs, bilateral, sequela: Secondary | ICD-10-CM | POA: Diagnosis not present

## 2021-04-02 DIAGNOSIS — J431 Panlobular emphysema: Secondary | ICD-10-CM | POA: Diagnosis not present

## 2021-04-02 DIAGNOSIS — S72002A Fracture of unspecified part of neck of left femur, initial encounter for closed fracture: Secondary | ICD-10-CM | POA: Diagnosis not present

## 2021-04-02 DIAGNOSIS — S72002D Fracture of unspecified part of neck of left femur, subsequent encounter for closed fracture with routine healing: Secondary | ICD-10-CM | POA: Diagnosis not present

## 2021-04-02 LAB — SURGICAL PCR SCREEN
MRSA, PCR: NEGATIVE
Staphylococcus aureus: NEGATIVE

## 2021-04-02 MED ORDER — CEFAZOLIN SODIUM-DEXTROSE 2-4 GM/100ML-% IV SOLN
2.0000 g | INTRAVENOUS | Status: AC
  Administered 2021-04-03: 2 g via INTRAVENOUS
  Filled 2021-04-02 (×3): qty 100

## 2021-04-02 MED ORDER — POVIDONE-IODINE 10 % EX SWAB: 2.0000 "application " | Freq: Once | CUTANEOUS | Status: DC

## 2021-04-02 MED ORDER — ASCORBIC ACID 500 MG PO TABS
500.0000 mg | ORAL_TABLET | Freq: Every day | ORAL | Status: DC
  Administered 2021-04-02 – 2021-04-20 (×19): 500 mg via ORAL
  Filled 2021-04-02 (×21): qty 1

## 2021-04-02 MED ORDER — ZINC SULFATE 220 (50 ZN) MG PO CAPS
220.0000 mg | ORAL_CAPSULE | Freq: Every day | ORAL | Status: DC
  Administered 2021-04-02 – 2021-04-20 (×19): 220 mg via ORAL
  Filled 2021-04-02 (×20): qty 1

## 2021-04-02 MED ORDER — TRANEXAMIC ACID-NACL 1000-0.7 MG/100ML-% IV SOLN
1000.0000 mg | INTRAVENOUS | Status: AC
  Administered 2021-04-03: 1000 mg via INTRAVENOUS

## 2021-04-02 MED ORDER — DM-GUAIFENESIN ER 30-600 MG PO TB12
1.0000 | ORAL_TABLET | Freq: Two times a day (BID) | ORAL | Status: DC
  Administered 2021-04-02 – 2021-04-27 (×50): 1 via ORAL
  Filled 2021-04-02 (×50): qty 1

## 2021-04-02 MED ORDER — SODIUM CHLORIDE 0.9 % IV SOLN
100.0000 mg | Freq: Every day | INTRAVENOUS | Status: AC
  Administered 2021-04-03 – 2021-04-04 (×2): 100 mg via INTRAVENOUS
  Filled 2021-04-02 (×2): qty 20

## 2021-04-02 MED ORDER — SODIUM CHLORIDE 0.9 % IV SOLN
200.0000 mg | Freq: Once | INTRAVENOUS | Status: AC
  Administered 2021-04-02: 200 mg via INTRAVENOUS
  Filled 2021-04-02: qty 40

## 2021-04-02 MED ORDER — IPRATROPIUM-ALBUTEROL 20-100 MCG/ACT IN AERS
1.0000 | INHALATION_SPRAY | Freq: Four times a day (QID) | RESPIRATORY_TRACT | Status: DC
  Administered 2021-04-02: 1 via RESPIRATORY_TRACT
  Filled 2021-04-02: qty 4

## 2021-04-02 MED ORDER — IPRATROPIUM-ALBUTEROL 20-100 MCG/ACT IN AERS
1.0000 | INHALATION_SPRAY | Freq: Three times a day (TID) | RESPIRATORY_TRACT | Status: DC
  Administered 2021-04-02 – 2021-04-13 (×26): 1 via RESPIRATORY_TRACT
  Filled 2021-04-02: qty 4

## 2021-04-02 MED ORDER — TRANEXAMIC ACID 1000 MG/10ML IV SOLN
2000.0000 mg | INTRAVENOUS | Status: DC
  Filled 2021-04-02: qty 20
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
## 2021-04-02 NOTE — Progress Notes (Signed)
Patient found to have greater trochanteric fracture nondisplaced extending on CT scan into the intertrochanteric region  Plan for [facility] S fixation on Monday with one of my partners Dr.Xu  Okay to eat tonight.  N.p.o. after midnight on Sunday
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
## 2021-04-02 NOTE — Progress Notes (Signed)
Call received from Dr. August Saucer to make patient NPO for surgery as of this moment.
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
## 2021-04-03 ENCOUNTER — Encounter (HOSPITAL_COMMUNITY)
Admission: EM | Disposition: A | Discharge status: Home / Self Care | Payer: Self-pay | Source: Home / Self Care | Attending: Internal Medicine

## 2021-04-03 ENCOUNTER — Inpatient Hospital Stay (HOSPITAL_COMMUNITY): Payer: Medicare HMO | Admitting: Certified Registered Nurse Anesthetist

## 2021-04-03 ENCOUNTER — Inpatient Hospital Stay (HOSPITAL_COMMUNITY): Payer: Medicare HMO

## 2021-04-03 ENCOUNTER — Encounter (HOSPITAL_COMMUNITY): Payer: Self-pay | Admitting: Internal Medicine

## 2021-04-03 DIAGNOSIS — S72002D Fracture of unspecified part of neck of left femur, subsequent encounter for closed fracture with routine healing: Secondary | ICD-10-CM | POA: Diagnosis not present

## 2021-04-03 DIAGNOSIS — S72002A Fracture of unspecified part of neck of left femur, initial encounter for closed fracture: Secondary | ICD-10-CM | POA: Diagnosis not present

## 2021-04-03 DIAGNOSIS — J431 Panlobular emphysema: Secondary | ICD-10-CM | POA: Diagnosis not present

## 2021-04-03 DIAGNOSIS — S2243XS Multiple fractures of ribs, bilateral, sequela: Secondary | ICD-10-CM | POA: Diagnosis not present

## 2021-04-03 HISTORY — PX: INTRAMEDULLARY (IM) NAIL INTERTROCHANTERIC: SHX5875

## 2021-04-03 LAB — COMPREHENSIVE METABOLIC PANEL
ALT: 14 U/L (ref 0–44)
AST: 14 U/L — ABNORMAL LOW (ref 15–41)
Albumin: 3.2 g/dL — ABNORMAL LOW (ref 3.5–5.0)
Alkaline Phosphatase: 96 U/L (ref 38–126)
Anion gap: 8 (ref 5–15)
BUN: 17 mg/dL (ref 6–20)
CO2: 28 mmol/L (ref 22–32)
Calcium: 9.2 mg/dL (ref 8.9–10.3)
Chloride: 100 mmol/L (ref 98–111)
Creatinine, Ser: 0.65 mg/dL (ref 0.61–1.24)
GFR, Estimated: >60 mL/min (ref >60)
Glucose, Bld: 127 mg/dL — ABNORMAL HIGH (ref 70–99)
Potassium: 4.6 mmol/L (ref 3.5–5.1)
Sodium: 136 mmol/L (ref 135–145)
Total Bilirubin: 0.6 mg/dL (ref 0.3–1.2)
Total Protein: 6.2 g/dL — ABNORMAL LOW (ref 6.5–8.1)

## 2021-04-03 LAB — CBC
HCT: 39.5 % (ref 39.0–52.0)
Hemoglobin: 13.2 g/dL (ref 13.0–17.0)
MCH: 31.1 pg (ref 26.0–34.0)
MCHC: 33.4 g/dL (ref 30.0–36.0)
MCV: 93.2 fL (ref 80.0–100.0)
Platelets: 392 10*3/uL (ref 150–400)
RBC: 4.24 MIL/uL (ref 4.22–5.81)
RDW: 13.4 % (ref 11.5–15.5)
WBC: 9.4 10*3/uL (ref 4.0–10.5)
nRBC: 0 % (ref 0.0–0.2)

## 2021-04-03 LAB — D-DIMER, QUANTITATIVE: D-Dimer, Quant: 1.43 ug/mL-FEU — ABNORMAL HIGH (ref 0.00–0.50)

## 2021-04-03 LAB — C-REACTIVE PROTEIN: CRP: 2.5 mg/dL — ABNORMAL HIGH (ref <1.0)

## 2021-04-03 SURGERY — FIXATION, FRACTURE, INTERTROCHANTERIC, WITH INTRAMEDULLARY ROD
Anesthesia: Monitor Anesthesia Care | Laterality: Left

## 2021-04-03 MED ORDER — MENTHOL 3 MG MT LOZG
1.0000 | LOZENGE | OROMUCOSAL | Status: DC | PRN
  Filled 2021-04-03: qty 9

## 2021-04-03 MED ORDER — ACETAMINOPHEN 160 MG/5ML PO SOLN: 1000.0000 mg | Freq: Once | ORAL | Status: DC | PRN

## 2021-04-03 MED ORDER — METHOCARBAMOL 1000 MG/10ML IJ SOLN
500.0000 mg | Freq: Four times a day (QID) | INTRAVENOUS | Status: DC | PRN
  Filled 2021-04-03: qty 5

## 2021-04-03 MED ORDER — OXYCODONE HCL 5 MG PO TABS
5.0000 mg | ORAL_TABLET | ORAL | Status: DC | PRN
  Administered 2021-04-06: 10 mg via ORAL
  Filled 2021-04-03 (×2): qty 2

## 2021-04-03 MED ORDER — DEXAMETHASONE SODIUM PHOSPHATE 10 MG/ML IJ SOLN
INTRAMUSCULAR | Status: AC
  Filled 2021-04-03: qty 1

## 2021-04-03 MED ORDER — ACETAMINOPHEN 500 MG PO TABS
1000.0000 mg | ORAL_TABLET | Freq: Four times a day (QID) | ORAL | Status: AC
  Administered 2021-04-03 – 2021-04-04 (×4): 1000 mg via ORAL
  Filled 2021-04-03 (×4): qty 2

## 2021-04-03 MED ORDER — TRANEXAMIC ACID-NACL 1000-0.7 MG/100ML-% IV SOLN
INTRAVENOUS | Status: AC
  Filled 2021-04-03: qty 200

## 2021-04-03 MED ORDER — POLYETHYLENE GLYCOL 3350 17 G PO PACK
17.0000 g | PACK | Freq: Every day | ORAL | Status: DC | PRN
  Administered 2021-04-03: 17 g via ORAL
  Filled 2021-04-03 (×2): qty 1

## 2021-04-03 MED ORDER — ONDANSETRON HCL 4 MG/2ML IJ SOLN
INTRAMUSCULAR | Status: AC
  Filled 2021-04-03: qty 2

## 2021-04-03 MED ORDER — LACTATED RINGERS IV SOLN: INTRAVENOUS | Status: DC | PRN

## 2021-04-03 MED ORDER — FENTANYL CITRATE (PF) 250 MCG/5ML IJ SOLN
INTRAMUSCULAR | Status: AC
  Filled 2021-04-03: qty 5

## 2021-04-03 MED ORDER — PHENYLEPHRINE HCL-NACL 10-0.9 MG/250ML-% IV SOLN
INTRAVENOUS | Status: DC | PRN
  Administered 2021-04-03: 25 ug/min via INTRAVENOUS

## 2021-04-03 MED ORDER — ACETAMINOPHEN 325 MG PO TABS
325.0000 mg | ORAL_TABLET | Freq: Four times a day (QID) | ORAL | Status: DC | PRN
  Administered 2021-04-09 – 2021-04-11 (×2): 650 mg via ORAL
  Filled 2021-04-03 (×2): qty 2

## 2021-04-03 MED ORDER — LIDOCAINE 2% (20 MG/ML) 5 ML SYRINGE
INTRAMUSCULAR | Status: AC
  Filled 2021-04-03: qty 5

## 2021-04-03 MED ORDER — ONDANSETRON HCL 4 MG/2ML IJ SOLN: 4.0000 mg | Freq: Four times a day (QID) | INTRAMUSCULAR | Status: DC | PRN

## 2021-04-03 MED ORDER — 0.9 % SODIUM CHLORIDE (POUR BTL) OPTIME
TOPICAL | Status: DC | PRN
  Administered 2021-04-03: 1000 mL

## 2021-04-03 MED ORDER — ACETAMINOPHEN 500 MG PO TABS: 1000.0000 mg | ORAL_TABLET | Freq: Once | ORAL | Status: DC | PRN

## 2021-04-03 MED ORDER — OXYCODONE HCL 5 MG PO TABS: 5.0000 mg | ORAL_TABLET | Freq: Once | ORAL | Status: DC | PRN

## 2021-04-03 MED ORDER — ALUM & MAG HYDROXIDE-SIMETH 200-200-20 MG/5ML PO SUSP: 30.0000 mL | ORAL | Status: DC | PRN

## 2021-04-03 MED ORDER — SODIUM CHLORIDE 0.9 % IV SOLN: INTRAVENOUS | Status: DC

## 2021-04-03 MED ORDER — DEXAMETHASONE SODIUM PHOSPHATE 10 MG/ML IJ SOLN
INTRAMUSCULAR | Status: DC | PRN
  Administered 2021-04-03: 10 mg via INTRAVENOUS

## 2021-04-03 MED ORDER — ENOXAPARIN SODIUM 40 MG/0.4ML IJ SOSY
40.0000 mg | PREFILLED_SYRINGE | INTRAMUSCULAR | Status: DC
  Administered 2021-04-04 – 2021-04-25 (×22): 40 mg via SUBCUTANEOUS
  Filled 2021-04-03 (×24): qty 0.4

## 2021-04-03 MED ORDER — PROPOFOL 10 MG/ML IV BOLUS
INTRAVENOUS | Status: DC | PRN
  Administered 2021-04-03: 20 mg via INTRAVENOUS
  Administered 2021-04-03 (×2): 30 mg via INTRAVENOUS
  Administered 2021-04-03 (×2): 20 mg via INTRAVENOUS
  Administered 2021-04-03: 30 mg via INTRAVENOUS
  Administered 2021-04-03: 20 mg via INTRAVENOUS
  Administered 2021-04-03: 30 mg via INTRAVENOUS

## 2021-04-03 MED ORDER — CEFAZOLIN SODIUM-DEXTROSE 2-4 GM/100ML-% IV SOLN
2.0000 g | Freq: Four times a day (QID) | INTRAVENOUS | Status: AC
  Administered 2021-04-03 (×3): 2 g via INTRAVENOUS
  Filled 2021-04-03 (×3): qty 100

## 2021-04-03 MED ORDER — ROCURONIUM BROMIDE 10 MG/ML (PF) SYRINGE
PREFILLED_SYRINGE | INTRAVENOUS | Status: AC
  Filled 2021-04-03: qty 10

## 2021-04-03 MED ORDER — OXYCODONE HCL 5 MG/5ML PO SOLN
5.0000 mg | Freq: Once | ORAL | Status: DC | PRN
Start: 2021-04-03 — End: 2021-04-03

## 2021-04-03 MED ORDER — SORBITOL 70 % SOLN
30.0000 mL | Freq: Every day | Status: DC | PRN
  Administered 2021-04-04 – 2021-04-13 (×6): 30 mL via ORAL
  Filled 2021-04-03 (×8): qty 30

## 2021-04-03 MED ORDER — PHENOL 1.4 % MT LIQD: 1.0000 | OROMUCOSAL | Status: DC | PRN

## 2021-04-03 MED ORDER — METHOCARBAMOL 500 MG PO TABS
500.0000 mg | ORAL_TABLET | Freq: Four times a day (QID) | ORAL | Status: DC | PRN
  Administered 2021-04-06 – 2021-04-20 (×19): 500 mg via ORAL
  Filled 2021-04-03 (×20): qty 1

## 2021-04-03 MED ORDER — MAGNESIUM CITRATE PO SOLN
1.0000 | Freq: Once | ORAL | Status: AC | PRN
  Administered 2021-04-05: 1 via ORAL
  Filled 2021-04-03: qty 296

## 2021-04-03 MED ORDER — ACETAMINOPHEN 10 MG/ML IV SOLN: 1000.0000 mg | Freq: Once | INTRAVENOUS | Status: DC | PRN

## 2021-04-03 MED ORDER — OXYCODONE HCL 5 MG PO TABS
10.0000 mg | ORAL_TABLET | ORAL | Status: DC | PRN
  Administered 2021-04-03 – 2021-04-07 (×5): 15 mg via ORAL
  Administered 2021-04-07: 10 mg via ORAL
  Administered 2021-04-08 – 2021-04-12 (×11): 15 mg via ORAL
  Filled 2021-04-03 (×18): qty 3

## 2021-04-03 MED ORDER — MIDAZOLAM HCL 5 MG/5ML IJ SOLN
INTRAMUSCULAR | Status: DC | PRN
  Administered 2021-04-03: 2 mg via INTRAVENOUS

## 2021-04-03 MED ORDER — TRANEXAMIC ACID-NACL 1000-0.7 MG/100ML-% IV SOLN
1000.0000 mg | Freq: Once | INTRAVENOUS | Status: AC
  Administered 2021-04-03: 1000 mg via INTRAVENOUS

## 2021-04-03 MED ORDER — DOCUSATE SODIUM 100 MG PO CAPS
100.0000 mg | ORAL_CAPSULE | Freq: Two times a day (BID) | ORAL | Status: DC
  Administered 2021-04-03 – 2021-04-27 (×49): 100 mg via ORAL
  Filled 2021-04-03 (×49): qty 1

## 2021-04-03 MED ORDER — PROPOFOL 500 MG/50ML IV EMUL
INTRAVENOUS | Status: DC | PRN
  Administered 2021-04-03: 100 ug/kg/min via INTRAVENOUS

## 2021-04-03 MED ORDER — BUPIVACAINE IN DEXTROSE 0.75-8.25 % IT SOLN
INTRATHECAL | Status: DC | PRN
  Administered 2021-04-03: 1.8 mL via INTRATHECAL

## 2021-04-03 MED ORDER — ONDANSETRON HCL 4 MG/2ML IJ SOLN
INTRAMUSCULAR | Status: DC | PRN
  Administered 2021-04-03: 4 mg via INTRAVENOUS

## 2021-04-03 MED ORDER — FENTANYL CITRATE (PF) 100 MCG/2ML IJ SOLN: 25.0000 ug | INTRAMUSCULAR | Status: DC | PRN

## 2021-04-03 MED ORDER — PHENYLEPHRINE 40 MCG/ML (10ML) SYRINGE FOR IV PUSH (FOR BLOOD PRESSURE SUPPORT)
PREFILLED_SYRINGE | INTRAVENOUS | Status: DC | PRN
  Administered 2021-04-03 (×3): 80 ug via INTRAVENOUS

## 2021-04-03 MED ORDER — PROPOFOL 10 MG/ML IV BOLUS
INTRAVENOUS | Status: AC
  Filled 2021-04-03: qty 20

## 2021-04-03 MED ORDER — ONDANSETRON HCL 4 MG PO TABS
4.0000 mg | ORAL_TABLET | Freq: Four times a day (QID) | ORAL | Status: DC | PRN
  Filled 2021-04-03: qty 1

## 2021-04-03 MED ORDER — HYDROMORPHONE HCL 1 MG/ML IJ SOLN
0.5000 mg | INTRAMUSCULAR | Status: DC | PRN
  Administered 2021-04-03 – 2021-04-09 (×31): 1 mg via INTRAVENOUS
  Administered 2021-04-09: 0.5 mg via INTRAVENOUS
  Administered 2021-04-10 (×2): 1 mg via INTRAVENOUS
  Filled 2021-04-03 (×35): qty 1

## 2021-04-03 MED ORDER — MIDAZOLAM HCL 2 MG/2ML IJ SOLN
INTRAMUSCULAR | Status: AC
  Filled 2021-04-03: qty 2

## 2021-04-03 MED ORDER — FENTANYL CITRATE (PF) 250 MCG/5ML IJ SOLN
INTRAMUSCULAR | Status: DC | PRN
  Administered 2021-04-03 (×2): 50 ug via INTRAVENOUS

## 2021-04-03 SURGICAL SUPPLY — 42 items
BAG COUNTER SPONGE SURGICOUNT (BAG) ×2 IMPLANT
BIT DRILL INTERTAN LAG SCREW (BIT) ×1 IMPLANT
BNDG COHESIVE 4X5 TAN STRL (GAUZE/BANDAGES/DRESSINGS) ×2 IMPLANT
BNDG COHESIVE 6X5 TAN STRL LF (GAUZE/BANDAGES/DRESSINGS) IMPLANT
BNDG GAUZE ELAST 4 BULKY (GAUZE/BANDAGES/DRESSINGS) ×2 IMPLANT
COVER PERINEAL POST (MISCELLANEOUS) ×2 IMPLANT
COVER SURGICAL LIGHT HANDLE (MISCELLANEOUS) ×2 IMPLANT
DRAPE C-ARMOR (DRAPES) ×2 IMPLANT
DRAPE STERI IOBAN 125X83 (DRAPES) ×2 IMPLANT
DRESSING MEPILEX FLEX 4X4 (GAUZE/BANDAGES/DRESSINGS) IMPLANT
DRSG MEPILEX BORDER 4X4 (GAUZE/BANDAGES/DRESSINGS) ×2 IMPLANT
DRSG MEPILEX BORDER 4X8 (GAUZE/BANDAGES/DRESSINGS) ×2 IMPLANT
DRSG MEPILEX FLEX 4X4 (GAUZE/BANDAGES/DRESSINGS) ×2
DRSG PAD ABDOMINAL 8X10 ST (GAUZE/BANDAGES/DRESSINGS) ×4 IMPLANT
DURAPREP 26ML APPLICATOR (WOUND CARE) ×2 IMPLANT
ELECT REM PT RETURN 9FT ADLT (ELECTROSURGICAL) ×2
ELECTRODE REM PT RTRN 9FT ADLT (ELECTROSURGICAL) ×1 IMPLANT
GLOVE SURG LTX SZ7 (GLOVE) ×2 IMPLANT
GLOVE SURG NEOP MICRO LF SZ7.5 (GLOVE) ×4 IMPLANT
GLOVE SURG UNDER POLY LF SZ7 (GLOVE) ×10 IMPLANT
GOWN STRL REIN XL XLG (GOWN DISPOSABLE) ×2 IMPLANT
GUIDE PIN 3.2X343 (PIN) ×2
GUIDE PIN 3.2X343MM (PIN) ×2
KIT BASIN OR (CUSTOM PROCEDURE TRAY) ×2 IMPLANT
KIT TURNOVER KIT B (KITS) ×2 IMPLANT
MANIFOLD NEPTUNE II (INSTRUMENTS) ×2 IMPLANT
NAIL TRIGEN 10MMX40CM-125 LEFT (Nail) ×1 IMPLANT
NS IRRIG 1000ML POUR BTL (IV SOLUTION) ×2 IMPLANT
PACK GENERAL/GYN (CUSTOM PROCEDURE TRAY) ×2 IMPLANT
PAD ARMBOARD 7.5X6 YLW CONV (MISCELLANEOUS) ×4 IMPLANT
PAD CAST 4YDX4 CTTN HI CHSV (CAST SUPPLIES) ×2 IMPLANT
PADDING CAST COTTON 4X4 STRL (CAST SUPPLIES) ×2
PIN GUIDE 3.2X343MM (PIN) IMPLANT
SCREW LAG COMPR KIT 95/90 (Screw) ×1 IMPLANT
STAPLER VISISTAT 35W (STAPLE) ×2 IMPLANT
SUT VIC AB 0 CT1 27 (SUTURE) ×1
SUT VIC AB 0 CT1 27XBRD ANBCTR (SUTURE) ×1 IMPLANT
SUT VIC AB 2-0 CT1 27 (SUTURE) ×1
SUT VIC AB 2-0 CT1 TAPERPNT 27 (SUTURE) ×1 IMPLANT
TOWEL GREEN STERILE (TOWEL DISPOSABLE) ×2 IMPLANT
TOWEL GREEN STERILE FF (TOWEL DISPOSABLE) ×2 IMPLANT
WATER STERILE IRR 1000ML POUR (IV SOLUTION) ×2 IMPLANT

## 2021-04-03 NOTE — Discharge Instructions (Signed)
° ° °  1. Change dressings as needed °2. May shower but keep incisions covered and dry °3. Take lovenox to prevent blood clots °4. Take stool softeners as needed °5. Take pain meds as needed ° °

## 2021-04-03 NOTE — Op Note (Signed)
   Date of Surgery: 04/03/2021  INDICATIONS: Mr. Brandis is a 55 y.o.-year-old male who sustained a left hip fracture. The risks and benefits of the procedure discussed with the Patient prior to the procedure and all questions were answered; consent was obtained.  PREOPERATIVE DIAGNOSIS: left nondisplaced intertrochanteric fracture   POSTOPERATIVE DIAGNOSIS: Same   PROCEDURE: Open treatment of intertrochanteric fracture with intramedullary implant. CPT [id number]   SURGEON: N. Glee Arvin, M.D.   ASSIST: none  ANESTHESIA: spinal  IV FLUIDS AND URINE: See anesthesia record   ESTIMATED BLOOD LOSS: 100 cc  IMPLANTS: [employer] InterTAN 10 x 40, 95/90 compression screws  DRAINS: None.   COMPLICATIONS: see description of procedure.   DESCRIPTION OF PROCEDURE: The patient was brought to the operating room and placed supine on the operating table. The patient's leg had been signed prior to the procedure. The patient had the anesthesia placed by the anesthesiologist. The prep verification and incision time-outs were performed to confirm that this was the correct patient, site, side and location. The patient had an SCD on the opposite lower extremity. The patient did receive antibiotics prior to the incision and was re-dosed during the procedure as needed at indicated intervals. The patient was positioned on the fracture table with the table in traction and internal rotation to reduce the hip. The well leg was placed in a scissor position and all bony prominences were well-padded. The patient had the lower extremity prepped and draped in the standard surgical fashion. The incision was made 4 finger breadths superior to the greater trochanter. A guide pin was inserted into the tip of the greater trochanter under fluoroscopic guidance. An opening reamer was used to gain access to the femoral canal. The nail length was measured and inserted down the femoral canal to its proper depth. The appropriate  version of insertion for the lag screw was found under fluoroscopy. A pin was inserted up the femoral neck through the jig. Then, a second antirotation pin was inserted inferior to the first pin. The length of the lag screw was then measured. The lag screw was inserted as near to center-center in the head as possible. The antirotation pin was then taken out and an interdigitating compression screw was placed in its place. The leg was taken out of traction, then the interdigitating compression screw was used to compress across the fracture.  The wound was copiously irrigated with saline and the subcutaneous layer closed with 2.0 vicryl and the skin was reapproximated with staples. The wounds were cleaned and dried a final time and a sterile dressing was placed. The hip was taken through a range of motion at the end of the case under fluoroscopic imaging to visualize the approach-withdraw phenomenon and confirm implant length in the head. The patient was then awakened from anesthesia and taken to the recovery room in stable condition. All counts were correct at the end of the case.   POSTOPERATIVE PLAN: The patient will be weight bearing as tolerated and will return in 2 weeks for staple removal and the patient will receive DVT prophylaxis based on other medications, activity level, and risk ratio of bleeding to thrombosis.   Mayra Reel, MD [facility] 9:28 AM

## 2021-04-03 NOTE — Transfer of Care (Signed)
Immediate Anesthesia Transfer of Care Note  Patient: Andrew Wheeler  Procedure(s) Performed: INTRAMEDULLARY (IM) NAIL INTERTROCHANTRIC (Left)  Patient Location: PACU  Anesthesia Type:MAC and Spinal  Level of Consciousness: awake, alert , oriented and patient cooperative  Airway & Oxygen Therapy: Patient Spontanous Breathing and Patient connected to face mask oxygen  Post-op Assessment: Report given to RN and Post -op Vital signs reviewed and stable  Post vital signs: Reviewed  Last Vitals:  Vitals Value Taken Time  BP 141/86 04/03/21 1003  Temp 36.4 C 04/03/21 0945  Pulse 75 04/03/21 1016  Resp 14 04/03/21 1016  SpO2 100 % 04/03/21 1016  Vitals shown include unvalidated device data.  Last Pain:  Vitals:   04/03/21 1000  TempSrc:   PainSc: 0-No pain      Patients Stated Pain Goal: 3 (57/01/77 [id number])  Complications: No notable events documented.

## 2021-04-03 NOTE — H&P (Signed)

## 2021-04-03 NOTE — Progress Notes (Signed)
Made several attempts to call the charge crna # left for me no answer
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
## 2021-04-03 NOTE — Anesthesia Procedure Notes (Signed)
Procedure Name: MAC Date/Time: 04/03/2021 8:20 AM Performed by: Jenne Campus, CRNA Pre-anesthesia Checklist: Patient identified, Emergency Drugs available, Suction available and Patient being monitored Oxygen Delivery Method: Simple face mask

## 2021-04-03 NOTE — Anesthesia Procedure Notes (Signed)
Spinal  Patient location during procedure: OR Start time: 04/03/2021 8:18 AM End time: 04/03/2021 8:22 AM Reason for block: surgical anesthesia Staffing Performed: anesthesiologist  Anesthesiologist: Val Eagle, MD Preanesthetic Checklist Completed: patient identified, IV checked, risks and benefits discussed, surgical consent, monitors and equipment checked, pre-op evaluation and timeout performed Spinal Block Patient position: left lateral decubitus Prep: DuraPrep Patient monitoring: heart rate, cardiac monitor, continuous pulse ox and blood pressure Approach: midline Location: L4-5 Injection technique: single-shot Needle Needle type: Pencan  Needle gauge: 24 G Needle length: 9 cm Assessment Sensory level: T6 Events: CSF return

## 2021-04-03 NOTE — Anesthesia Postprocedure Evaluation (Signed)
Anesthesia Post Note  Patient: Andrew Wheeler  Procedure(s) Performed: INTRAMEDULLARY (IM) NAIL INTERTROCHANTRIC (Left)     Patient location during evaluation: PACU Anesthesia Type: MAC and Spinal Level of consciousness: awake and alert Pain management: pain level controlled Vital Signs Assessment: post-procedure vital signs reviewed and stable Respiratory status: spontaneous breathing, nonlabored ventilation, respiratory function stable and patient connected to nasal cannula oxygen Cardiovascular status: blood pressure returned to baseline and stable Postop Assessment: no apparent nausea or vomiting Anesthetic complications: no   No notable events documented.  Last Vitals:  Vitals:   04/03/21 1102 04/03/21 1526  BP: (!) 141/94 134/84  Pulse: 80 98  Resp: 16 16  Temp: 36.8 C 36.8 C  SpO2: 96% 94%    Last Pain:  Vitals:   04/03/21 1526  TempSrc: Oral  PainSc:                  Tracie Lindbloom

## 2021-04-03 NOTE — Anesthesia Preprocedure Evaluation (Addendum)
Anesthesia Evaluation  Patient identified by MRN, date of birth, ID band Patient awake    Reviewed: Allergy & Precautions, NPO status , Patient's Chart, lab work & pertinent test results  History of Anesthesia Complications Negative for: history of anesthetic complications  Airway Mallampati: II  TM Distance: >3 FB Neck ROM: Full    Dental  (+) Dental Advisory Given   Pulmonary COPD, Recent URI , Current Smoker and Patient abstained from smoking.,  Rib fractures s/p CPR for opioid overdose  Covid-19 Nucleic Acid Test Results Lab Results      Component                Value               Date                      SARSCOV2NAA              POSITIVE (A)        03/30/2021               breath sounds clear to auscultation       Cardiovascular  Rhythm:Regular     Neuro/Psych PSYCHIATRIC DISORDERS Anxiety Depression negative neurological ROS     GI/Hepatic GERD  ,(+)     substance abuse  ,   Endo/Other  negative endocrine ROS  Renal/GU negative Renal ROS     Musculoskeletal intertrochanter fracture left   Abdominal   Peds  Hematology negative hematology ROS (+) No results found for: PTT Lab Results      Component                Value               Date                      INR                      1.0                 03/31/2021           Lab Results      Component                Value               Date                      WBC                      9.4                 04/03/2021                HGB                      13.2                04/03/2021                HCT                      39.5                04/03/2021  MCV                      93.2                04/03/2021                PLT                      392                 04/03/2021           Denies blood thinners   Anesthesia Other Findings   Reproductive/Obstetrics                           Anesthesia  Physical Anesthesia Plan  ASA: 3  Anesthesia Plan: MAC and Spinal   Post-op Pain Management:    Induction:   PONV Risk Score and Plan: 0 and Propofol infusion and Treatment may vary due to age or medical condition  Airway Management Planned: Nasal Cannula  Additional Equipment: None  Intra-op Plan:   Post-operative Plan:   Informed Consent: I have reviewed the patients History and Physical, chart, labs and discussed the procedure including the risks, benefits and alternatives for the proposed anesthesia with the patient or authorized representative who has indicated his/her understanding and acceptance.     Dental advisory given  Plan Discussed with: CRNA and Surgeon  Anesthesia Plan Comments:        Anesthesia Quick Evaluation

## 2021-04-04 ENCOUNTER — Encounter (HOSPITAL_COMMUNITY): Payer: Self-pay | Admitting: Orthopaedic Surgery

## 2021-04-04 DIAGNOSIS — S2243XS Multiple fractures of ribs, bilateral, sequela: Secondary | ICD-10-CM | POA: Diagnosis not present

## 2021-04-04 DIAGNOSIS — J431 Panlobular emphysema: Secondary | ICD-10-CM | POA: Diagnosis not present

## 2021-04-04 DIAGNOSIS — S72002D Fracture of unspecified part of neck of left femur, subsequent encounter for closed fracture with routine healing: Secondary | ICD-10-CM | POA: Diagnosis not present

## 2021-04-04 LAB — COMPREHENSIVE METABOLIC PANEL
ALT: 15 U/L (ref 0–44)
AST: 17 U/L (ref 15–41)
Albumin: 2.9 g/dL — ABNORMAL LOW (ref 3.5–5.0)
Alkaline Phosphatase: 82 U/L (ref 38–126)
Anion gap: 8 (ref 5–15)
BUN: 21 mg/dL — ABNORMAL HIGH (ref 6–20)
CO2: 26 mmol/L (ref 22–32)
Calcium: 8.9 mg/dL (ref 8.9–10.3)
Chloride: 100 mmol/L (ref 98–111)
Creatinine, Ser: 0.73 mg/dL (ref 0.61–1.24)
GFR, Estimated: >60 mL/min (ref >60)
Glucose, Bld: 186 mg/dL — ABNORMAL HIGH (ref 70–99)
Potassium: 4.7 mmol/L (ref 3.5–5.1)
Sodium: 134 mmol/L — ABNORMAL LOW (ref 135–145)
Total Bilirubin: 0.4 mg/dL (ref 0.3–1.2)
Total Protein: 5.9 g/dL — ABNORMAL LOW (ref 6.5–8.1)

## 2021-04-04 LAB — CBC
HCT: 37.3 % — ABNORMAL LOW (ref 39.0–52.0)
Hemoglobin: 12.2 g/dL — ABNORMAL LOW (ref 13.0–17.0)
MCH: 30.6 pg (ref 26.0–34.0)
MCHC: 32.7 g/dL (ref 30.0–36.0)
MCV: 93.5 fL (ref 80.0–100.0)
Platelets: 390 10*3/uL (ref 150–400)
RBC: 3.99 MIL/uL — ABNORMAL LOW (ref 4.22–5.81)
RDW: 13.3 % (ref 11.5–15.5)
WBC: 11.7 10*3/uL — ABNORMAL HIGH (ref 4.0–10.5)
nRBC: 0 % (ref 0.0–0.2)

## 2021-04-04 LAB — MAGNESIUM: Magnesium: 1.9 mg/dL (ref 1.7–2.4)

## 2021-04-04 MED ORDER — OXYCODONE HCL 5 MG PO TABS: 5.0000 mg | ORAL_TABLET | Freq: Four times a day (QID) | ORAL | 0 refills | Status: DC | PRN

## 2021-04-04 MED ORDER — ENOXAPARIN SODIUM 40 MG/0.4ML IJ SOSY: 40.0000 mg | PREFILLED_SYRINGE | INTRAMUSCULAR | 0 refills | Status: DC

## 2021-04-04 NOTE — Progress Notes (Signed)
Subjective: 1 Day Post-Op Procedure(s) (LRB): INTRAMEDULLARY (IM) NAIL INTERTROCHANTRIC (Left) Patient reports pain as mild.    Objective: Vital signs in last 24 hours: Temp:  [97.8 F (36.6 C)-98.5 F (36.9 C)] 98.4 F (36.9 C) (07/11 0851) Pulse Rate:  [73-98] 96 (07/11 0851) Resp:  [13-20] 18 (07/11 0851) BP: (120-143)/(77-96) 130/80 (07/11 0851) SpO2:  [93 %-100 %] 98 % (07/11 0851)  Intake/Output from previous day: 07/10 0701 - 07/11 0700 In: 1673.6 [P.O.:480; I.V.:793.6; IV Piggyback:400] Out: 950 [Urine:900; Blood:50] Intake/Output this shift: Total I/O In: -  Out: 550 [Urine:550]  Recent Labs    04/03/21 0350 04/04/21 0425  HGB 13.2 12.2*   Recent Labs    04/03/21 0350 04/04/21 0425  WBC 9.4 11.7*  RBC 4.24 3.99*  HCT 39.5 37.3*  PLT 392 390   Recent Labs    04/03/21 0350 04/04/21 0425  NA 136 134*  K 4.6 4.7  CL 100 100  CO2 28 26  BUN 17 21*  CREATININE 0.65 0.73  GLUCOSE 127* 186*  CALCIUM 9.2 8.9   No results for input(s): LABPT, INR in the last 72 hours.  Neurologically intact Neurovascular intact Sensation intact distally Intact pulses distally Dorsiflexion/Plantar flexion intact Incision: dressing C/D/I No cellulitis present Compartment soft   Assessment/Plan: 1 Day Post-Op Procedure(s) (LRB): INTRAMEDULLARY (IM) NAIL INTERTROCHANTRIC (Left) Up with therapy WBAT LLE Lovenox/scds for dvt ppx D/c dispo per primary team F/u with Dr. Roda Shutters 2 weeks po      Cristie Hem 04/04/2021, 9:46 AM
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
## 2021-04-04 NOTE — Plan of Care (Signed)
  Problem: Education: Goal: Knowledge of General Education information will improve Description: Including pain rating scale, medication(s)/side effects and non-pharmacologic comfort measures Outcome: Progressing   Problem: Health Behavior/Discharge Planning: Goal: Ability to manage health-related needs will improve Outcome: Progressing   Problem: Clinical Measurements: Goal: Ability to maintain clinical measurements within normal limits will improve Outcome: Progressing   Problem: Activity: Goal: Risk for activity intolerance will decrease Outcome: Progressing   Problem: Nutrition: Goal: Adequate nutrition will be maintained Outcome: Progressing   Problem: Pain Managment: Goal: General experience of comfort will improve Outcome: Progressing   Problem: Safety: Goal: Ability to remain free from injury will improve Outcome: Progressing   Problem: Elimination: Goal: Will not experience complications related to bowel motility Outcome: Not Progressing
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
## 2021-04-05 DIAGNOSIS — S72002D Fracture of unspecified part of neck of left femur, subsequent encounter for closed fracture with routine healing: Secondary | ICD-10-CM | POA: Diagnosis not present

## 2021-04-05 DIAGNOSIS — J431 Panlobular emphysema: Secondary | ICD-10-CM | POA: Diagnosis not present

## 2021-04-05 DIAGNOSIS — S2243XS Multiple fractures of ribs, bilateral, sequela: Secondary | ICD-10-CM | POA: Diagnosis not present

## 2021-04-05 LAB — CBC
HCT: 34.8 % — ABNORMAL LOW (ref 39.0–52.0)
Hemoglobin: 11.7 g/dL — ABNORMAL LOW (ref 13.0–17.0)
MCH: 31 pg (ref 26.0–34.0)
MCHC: 33.6 g/dL (ref 30.0–36.0)
MCV: 92.3 fL (ref 80.0–100.0)
Platelets: 300 10*3/uL (ref 150–400)
RBC: 3.77 MIL/uL — ABNORMAL LOW (ref 4.22–5.81)
RDW: 13.5 % (ref 11.5–15.5)
WBC: 9.6 10*3/uL (ref 4.0–10.5)
nRBC: 0 % (ref 0.0–0.2)

## 2021-04-05 LAB — BASIC METABOLIC PANEL
Anion gap: 6 (ref 5–15)
BUN: 24 mg/dL — ABNORMAL HIGH (ref 6–20)
CO2: 28 mmol/L (ref 22–32)
Calcium: 8.7 mg/dL — ABNORMAL LOW (ref 8.9–10.3)
Chloride: 102 mmol/L (ref 98–111)
Creatinine, Ser: 0.63 mg/dL (ref 0.61–1.24)
GFR, Estimated: >60 mL/min (ref >60)
Glucose, Bld: 104 mg/dL — ABNORMAL HIGH (ref 70–99)
Potassium: 4.3 mmol/L (ref 3.5–5.1)
Sodium: 136 mmol/L (ref 135–145)
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
## 2021-04-06 DIAGNOSIS — J431 Panlobular emphysema: Secondary | ICD-10-CM | POA: Diagnosis not present

## 2021-04-06 DIAGNOSIS — S72002A Fracture of unspecified part of neck of left femur, initial encounter for closed fracture: Secondary | ICD-10-CM | POA: Diagnosis not present

## 2021-04-06 DIAGNOSIS — F411 Generalized anxiety disorder: Secondary | ICD-10-CM | POA: Diagnosis not present

## 2021-04-06 DIAGNOSIS — F32A Depression, unspecified: Secondary | ICD-10-CM | POA: Diagnosis not present

## 2021-04-06 LAB — CBC
HCT: 40 % (ref 39.0–52.0)
Hemoglobin: 13.3 g/dL (ref 13.0–17.0)
MCH: 31.1 pg (ref 26.0–34.0)
MCHC: 33.3 g/dL (ref 30.0–36.0)
MCV: 93.5 fL (ref 80.0–100.0)
Platelets: 311 10*3/uL (ref 150–400)
RBC: 4.28 MIL/uL (ref 4.22–5.81)
RDW: 13.3 % (ref 11.5–15.5)
WBC: 9.1 10*3/uL (ref 4.0–10.5)
nRBC: 0 % (ref 0.0–0.2)

## 2021-04-06 NOTE — Plan of Care (Signed)
  Problem: Education: Goal: Knowledge of General Education information will improve Description: Including pain rating scale, medication(s)/side effects and non-pharmacologic comfort measures Outcome: Adequate for Discharge   Problem: Health Behavior/Discharge Planning: Goal: Ability to manage health-related needs will improve Outcome: Adequate for Discharge   Problem: Clinical Measurements: Goal: Ability to maintain clinical measurements within normal limits will improve Outcome: Adequate for Discharge Goal: Will remain free from infection Outcome: Adequate for Discharge   
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
## 2021-04-06 NOTE — TOC Progression Note (Signed)
Transition of Care [facility]) - Progression Note    Patient Details  Name: Andrew Wheeler MRN: [id number] Date of Birth: [birth year]  Transition of Care [facility]) CM/SW Contact  Ralene Bathe, LCSWA Phone Number: 04/06/2021, 3:30 PM  Clinical Narrative:    CSW expanded the search for SNF to other counties because the two SNF who accept COVID positive patients do not accept the patient's insurance.    Pending :Bed offer   Expected Discharge Plan: Skilled Nursing Facility Barriers to Discharge: SNF Covid, Homeless with medical needs  Expected Discharge Plan and Services Expected Discharge Plan: Skilled Nursing Facility       Living arrangements for the past 2 months: Homeless                                       Social Determinants of Health (SDOH) Interventions    Readmission Risk Interventions No flowsheet data found.

## 2021-04-07 DIAGNOSIS — M25552 Pain in left hip: Secondary | ICD-10-CM | POA: Diagnosis not present

## 2021-04-07 DIAGNOSIS — S2243XS Multiple fractures of ribs, bilateral, sequela: Secondary | ICD-10-CM | POA: Diagnosis not present

## 2021-04-07 DIAGNOSIS — F32A Depression, unspecified: Secondary | ICD-10-CM | POA: Diagnosis not present

## 2021-04-07 MED ORDER — HYDROCORTISONE 1 % EX CREA
TOPICAL_CREAM | Freq: Three times a day (TID) | CUTANEOUS | Status: DC | PRN
  Filled 2021-04-07: qty 28

## 2021-04-07 NOTE — TOC Progression Note (Signed)
Transition of Care [facility]) - Progression Note    Patient Details  Name: Latravion Graves MRN: [id number] Date of Birth: [birth year]  Transition of Care [facility]) CM/SW Contact  Ralene Bathe, LCSWA Phone Number: 04/07/2021, 4:19 PM  Clinical Narrative:    CSW spoke with patient and presented new bed offers.  The patient stated that he did not have a choice but wanted to stay close to [facility].  CSW presented the agencies in [place] and the patient choose [address]    16:16-  CSW contacted Delorise Shiner with [facility] and verified that they have a bed available.  [place] [facility] will begin insurance authorization.    16:18-  Patient notified that [place] has a bed available  and CSW explained the insurance authorization process.   Pending- insurance authorization and medical readiness.    Expected Discharge Plan: Skilled Nursing Facility Barriers to Discharge: SNF Covid, Homeless with medical needs  Expected Discharge Plan and Services Expected Discharge Plan: Skilled Nursing Facility       Living arrangements for the past 2 months: Homeless                                       Social Determinants of Health (SDOH) Interventions    Readmission Risk Interventions No flowsheet data found.
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
## 2021-04-07 NOTE — Plan of Care (Signed)

## 2021-04-08 DIAGNOSIS — S72002A Fracture of unspecified part of neck of left femur, initial encounter for closed fracture: Secondary | ICD-10-CM | POA: Diagnosis not present

## 2021-04-08 MED ORDER — MAGNESIUM CITRATE PO SOLN
1.0000 | Freq: Once | ORAL | Status: AC
  Administered 2021-04-08: 1 via ORAL
  Filled 2021-04-08: qty 296
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
## 2021-04-08 NOTE — Plan of Care (Signed)
  Problem: Activity: Goal: Risk for activity intolerance will decrease Outcome: Progressing   Problem: Coping: Goal: Level of anxiety will decrease Outcome: Progressing   Problem: Safety: Goal: Ability to remain free from injury will improve Outcome: Progressing   Problem: Skin Integrity: Goal: Risk for impaired skin integrity will decrease Outcome: Progressing   

## 2021-04-08 NOTE — Progress Notes (Signed)
Notified E. Ouma, NP on call for [facility] that pt states he continues to be constipated and is requesting magnesium citrate. RN will continue to monitor.

## 2021-04-09 DIAGNOSIS — J42 Unspecified chronic bronchitis: Secondary | ICD-10-CM

## 2021-04-09 DIAGNOSIS — S72002D Fracture of unspecified part of neck of left femur, subsequent encounter for closed fracture with routine healing: Secondary | ICD-10-CM | POA: Diagnosis not present
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
## 2021-04-09 NOTE — Plan of Care (Signed)

## 2021-04-10 ENCOUNTER — Encounter (HOSPITAL_COMMUNITY): Payer: Self-pay | Admitting: Internal Medicine

## 2021-04-10 DIAGNOSIS — J42 Unspecified chronic bronchitis: Secondary | ICD-10-CM | POA: Diagnosis not present

## 2021-04-10 DIAGNOSIS — S72002D Fracture of unspecified part of neck of left femur, subsequent encounter for closed fracture with routine healing: Secondary | ICD-10-CM | POA: Diagnosis not present

## 2021-04-10 LAB — CREATININE, SERUM
Creatinine, Ser: 0.99 mg/dL (ref 0.61–1.24)
GFR, Estimated: >60 mL/min (ref >60)

## 2021-04-10 MED ORDER — HYDROMORPHONE HCL 1 MG/ML IJ SOLN
1.0000 mg | INTRAMUSCULAR | Status: DC | PRN
  Administered 2021-04-10 – 2021-04-12 (×9): 1 mg via INTRAVENOUS
  Filled 2021-04-10 (×9): qty 1

## 2021-04-10 MED ORDER — OXYCODONE HCL 5 MG PO TABS
15.0000 mg | ORAL_TABLET | ORAL | Status: DC | PRN
  Administered 2021-04-11 (×2): 15 mg via ORAL
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
## 2021-04-11 DIAGNOSIS — J42 Unspecified chronic bronchitis: Secondary | ICD-10-CM | POA: Diagnosis not present

## 2021-04-11 DIAGNOSIS — S72002D Fracture of unspecified part of neck of left femur, subsequent encounter for closed fracture with routine healing: Secondary | ICD-10-CM | POA: Diagnosis not present

## 2021-04-11 DIAGNOSIS — F411 Generalized anxiety disorder: Secondary | ICD-10-CM | POA: Diagnosis not present

## 2021-04-11 DIAGNOSIS — K219 Gastro-esophageal reflux disease without esophagitis: Secondary | ICD-10-CM | POA: Diagnosis not present

## 2021-04-11 NOTE — Plan of Care (Signed)
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
## 2021-04-11 NOTE — TOC Progression Note (Signed)
Transition of Care [facility]) - Progression Note    Patient Details  Name: Andrew Wheeler MRN: [id number] Date of Birth: 10/12/1965  [facility] CM/SW Contact  Ralene Bathe, LCSWA Phone Number: 04/11/2021, 11:25 AM  Clinical Narrative:     08:55-  CSW contacted Delorise Shiner with [facility] and was informed that insurance authorization approving the patient to go to SNF has not been received.    Pending:  insurance auth.    Expected Discharge Plan: Skilled Nursing Facility Barriers to Discharge: SNF Covid, Homeless with medical needs  Expected Discharge Plan and Services Expected Discharge Plan: Skilled Nursing Facility       Living arrangements for the past 2 months: Homeless                                       Social Determinants of Health (SDOH) Interventions    Readmission Risk Interventions No flowsheet data found.

## 2021-04-12 DIAGNOSIS — U071 COVID-19: Secondary | ICD-10-CM

## 2021-04-12 DIAGNOSIS — S72002D Fracture of unspecified part of neck of left femur, subsequent encounter for closed fracture with routine healing: Secondary | ICD-10-CM | POA: Diagnosis not present

## 2021-04-12 MED ORDER — MAGNESIUM CITRATE PO SOLN
1.0000 | Freq: Every day | ORAL | Status: DC | PRN
  Filled 2021-04-12: qty 296

## 2021-04-12 MED ORDER — ACETAMINOPHEN 325 MG PO TABS
650.0000 mg | ORAL_TABLET | Freq: Four times a day (QID) | ORAL | Status: DC | PRN
  Administered 2021-04-18: 650 mg via ORAL
  Filled 2021-04-12 (×2): qty 2

## 2021-04-12 MED ORDER — OXYCODONE HCL 5 MG PO TABS
15.0000 mg | ORAL_TABLET | ORAL | Status: DC | PRN
Start: 2021-04-12 — End: 2021-04-17
  Administered 2021-04-12 – 2021-04-17 (×26): 15 mg via ORAL
  Filled 2021-04-12 (×26): qty 3

## 2021-04-12 MED ORDER — HYDROMORPHONE HCL 1 MG/ML IJ SOLN
1.0000 mg | INTRAMUSCULAR | Status: DC | PRN
Start: 2021-04-12 — End: 2021-04-16
  Administered 2021-04-12 – 2021-04-16 (×21): 1 mg via INTRAVENOUS
  Filled 2021-04-12 (×21): qty 1

## 2021-04-12 NOTE — Assessment & Plan Note (Signed)
-   Continue BuSpar 

## 2021-04-12 NOTE — Assessment & Plan Note (Signed)
-  he was negative last time he was in the hospital, suspect he might have gotten infected in the few days that he was outpatient.  Chest x-ray does show some left-sided infiltrate but that was there before, and he does have increased risk of having atelectasis due to recent CPR/rib fracture/poor inspiratory effort. -due to increased congestion on 7/9 and risk factors with COPD, he received 3 days of IV Remdesivir. -Has been on room air throughout, no need for steroids -Completed 10 days of isolation

## 2021-04-12 NOTE — Assessment & Plan Note (Signed)
-   Continue Wellbutrin 
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
## 2021-04-12 NOTE — Assessment & Plan Note (Signed)
-   s/p CPR - continue supportive care and pain control
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
## 2021-04-12 NOTE — Assessment & Plan Note (Addendum)
-   s/p IM nail fixation on 04/03/2021 with orthopedic surgery -SNF placement pending; difficulty finding placement  -Still on large amounts of pain medication which need to continue to be weaned; placed dilaudid on auto-taper to off by 7/27 - do NOT resume IV opioids or increase PO; patient is not in any amount of pain to justify use of IV pain meds currently; he is 17 days postop and has been OOB with therapy - staples removed on 7/25; follow up with Dr. Roda Shutters in 4 weeks for xrays

## 2021-04-12 NOTE — Assessment & Plan Note (Signed)
Continue Protonix °

## 2021-04-12 NOTE — Plan of Care (Signed)
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
## 2021-04-12 NOTE — TOC Progression Note (Signed)
Transition of Care [facility]) - Progression Note    Patient Details  Name: Andrew Wheeler MRN: [id number] Date of Birth: [birth year]  [facility]) CM/SW Contact  Ralene Bathe, LCSWA Phone Number: 04/12/2021, 9:53 AM  Clinical Narrative:     08:50-  CSW spoke with Delorise Shiner at [facility] to inquire about whether the facility has received insurance authorization.  CSW was informed that insurance authorization has not been received.  The facility requested more clinicals.  Clinicals sent.    Pending: insurance authorization   Expected Discharge Plan: Skilled Nursing Facility Barriers to Discharge: SNF Covid, Homeless with medical needs  Expected Discharge Plan and Services Expected Discharge Plan: Skilled Nursing Facility       Living arrangements for the past 2 months: Homeless                                       Social Determinants of Health (SDOH) Interventions    Readmission Risk Interventions No flowsheet data found.

## 2021-04-12 NOTE — Assessment & Plan Note (Signed)
-   no s/s exacerbation 

## 2021-04-12 NOTE — Care Management Important Message (Signed)
Important Message  Patient Details  Name: Andrew Wheeler MRN: [id number] Date of Birth: 02-11-1966   Medicare Important Message Given:  Yes - Important Message mailed due to current National Emergency   Verbal consent obtained due to current National Emergency  Relationship to patient: Self Contact Name: Tregan Call Date: 04/12/21  Time: 1350 Phone: [phone] Outcome: Spoke with contact Important Message mailed to: Patient address on file    [name] 04/12/2021, 1:51 PM

## 2021-04-13 DIAGNOSIS — S72002D Fracture of unspecified part of neck of left femur, subsequent encounter for closed fracture with routine healing: Secondary | ICD-10-CM | POA: Diagnosis not present

## 2021-04-13 MED ORDER — IPRATROPIUM-ALBUTEROL 20-100 MCG/ACT IN AERS
1.0000 | INHALATION_SPRAY | Freq: Four times a day (QID) | RESPIRATORY_TRACT | Status: DC | PRN
  Administered 2021-04-19: 1 via RESPIRATORY_TRACT

## 2021-04-13 MED ORDER — LACTULOSE 10 GM/15ML PO SOLN: 30.0000 g | Freq: Two times a day (BID) | ORAL | Status: DC | PRN

## 2021-04-13 NOTE — TOC Progression Note (Signed)
Transition of Care [facility]) - Progression Note    Patient Details  Name: Andrew Wheeler MRN: [id number] Date of Birth: [birth year]  Transition of Care [facility]) CM/SW Contact  Ralene Bathe, LCSWA Phone Number: 04/13/2021, 10:46 AM  Clinical Narrative:     09:32-  CSW spoke with Delorise Shiner from [place] and was informed that she has not received insurance authorizations and at this time the facility can only accept patients who are COVID positive due or in the 10 day window due to a recent outbreak.  It is unknown when the patient can d/c to this facility due to the patient no longer being on COVID quarantine status.  Pending: Bed availability and insurance auth.    Expected Discharge Plan: Skilled Nursing Facility Barriers to Discharge: SNF Covid, Homeless with medical needs  Expected Discharge Plan and Services Expected Discharge Plan: Skilled Nursing Facility       Living arrangements for the past 2 months: Homeless                                       Social Determinants of Health (SDOH) Interventions    Readmission Risk Interventions No flowsheet data found.

## 2021-04-13 NOTE — Plan of Care (Signed)

## 2021-04-13 NOTE — Plan of Care (Signed)
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
## 2021-04-13 NOTE — Plan of Care (Signed)
  Problem: Education: Goal: Knowledge of General Education information will improve Description: Including pain rating scale, medication(s)/side effects and non-pharmacologic comfort measures Outcome: Progressing   Problem: Health Behavior/Discharge Planning: Goal: Ability to manage health-related needs will improve Outcome: Progressing   Problem: Clinical Measurements: Goal: Will remain free from infection Outcome: Progressing   

## 2021-04-14 DIAGNOSIS — S72002D Fracture of unspecified part of neck of left femur, subsequent encounter for closed fracture with routine healing: Secondary | ICD-10-CM | POA: Diagnosis not present
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
## 2021-04-14 NOTE — Progress Notes (Signed)
PT Cancellation Note  Patient Details Name: Andrew Wheeler MRN: [id number] DOB: 10/04/1965   Cancelled Treatment:    Reason Eval/Treat Not Completed: (P) Patient declined, no reason specified (reports to come back later this pm, will f/u per POC.)   Carmeline Kowal Artis Delay 04/14/2021, 2:31 PM  [name] , PTA Acute Rehabilitation Services Pager [phone] Office [phone]

## 2021-04-14 NOTE — TOC Progression Note (Signed)
Transition of Care [facility]) - Progression Note    Patient Details  Name: Andrew Wheeler MRN: [id number] Date of Birth: 10-28-1965  Transition of [facility]) CM/SW Contact  Ralene Bathe, LCSWA Phone Number: 04/14/2021, 10:40 AM  Clinical Narrative:    08:40-  CSW contacted Delorise Shiner with [employer] to inquire about insurance authorization.  Authorization is still pending.     Expected Discharge Plan: Skilled Nursing Facility Barriers to Discharge: SNF Covid, Homeless with medical needs  Expected Discharge Plan and Services Expected Discharge Plan: Skilled Nursing Facility       Living arrangements for the past 2 months: Homeless                                       Social Determinants of Health (SDOH) Interventions    Readmission Risk Interventions No flowsheet data found.

## 2021-04-15 DIAGNOSIS — S72002D Fracture of unspecified part of neck of left femur, subsequent encounter for closed fracture with routine healing: Secondary | ICD-10-CM | POA: Diagnosis not present

## 2021-04-15 NOTE — TOC Progression Note (Addendum)
Transition of Care [facility]) - Progression Note    Patient Details  Name: Andrew Wheeler MRN: [id number] Date of Birth: 08-09-1966  Transition of Care [facility]) CM/SW Contact  Ralene Bathe, LCSWA Phone Number: 04/15/2021, 3:48 PM  Clinical Narrative:    CSW contacted all of the four facilities that accepted the patient in the Hub for placement.  See below.  [place]- has started [place], but now have a COVID outbreak and cannot accept at this time [facility]- has a COVID outbreak and cannot accept at this time [place]- Spoke with Malena Peer who is contacting [facility] manager of nursing to inquire about the facilities ability to offer a bed due to the patient's mental health, substance use, and homelessness [facility] Beat-  CSW called and left a VM requesting a returned call.    Pending bed offer and insurance auth.  16:12- CSW spoke with the patient about natural supports.  The patient reported that he does not have any family in the area.  The patient reports that he does not have any family or friends who could assist him.    CSW inquired about the patient's living status prior to admission.  The patient reported that he was "on the street", but did stay in a hotel sometimes.  CSW informed the patient that insurance Berkley Harvey is still pending and asked about options for housing in the event that insurance does not approve.  The patient reported that he does not get his "check" until the 3rd of the month, so he would not have any money until then.  The patient inquired about inpatient substance use facilities.  CSW informed the patient that he would have to initiate contact with these agencies as it shows them his willingness to be sober and desire to change.  CSW gave the patient a list of facilities.   CSW contacted Berna Spare with [employer] as CSW saw that the patient's address in his other chart (this one needs to be merged) was the [employer].  CSW was informed that the patient would more than likely not  be able to return to the facility.    Expected Discharge Plan: Skilled Nursing Facility Barriers to Discharge: SNF Covid, Homeless with medical needs  Expected Discharge Plan and Services Expected Discharge Plan: Skilled Nursing Facility       Living arrangements for the past 2 months: Homeless                                       Social Determinants of Health (SDOH) Interventions    Readmission Risk Interventions No flowsheet data found.

## 2021-04-15 NOTE — Plan of Care (Signed)
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
## 2021-04-15 NOTE — Plan of Care (Signed)
  Problem: Health Behavior/Discharge Planning: Goal: Ability to manage health-related needs will improve Outcome: Progressing   Problem: Activity: Goal: Risk for activity intolerance will decrease Outcome: Progressing   Problem: Pain Managment: Goal: General experience of comfort will improve Outcome: Not Progressing   Patient is asking for pain medication every 2 hours stating that his pain is always above a 6 and dilaudid only relieves it for 30 minutes at a time. Each time RN enters room pt is resting peacefully in bed/chair.
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
## 2021-04-16 DIAGNOSIS — S72002D Fracture of unspecified part of neck of left femur, subsequent encounter for closed fracture with routine healing: Secondary | ICD-10-CM | POA: Diagnosis not present

## 2021-04-16 MED ORDER — HYDROMORPHONE HCL 1 MG/ML IJ SOLN
0.5000 mg | Freq: Two times a day (BID) | INTRAMUSCULAR | Status: AC | PRN
Start: 2021-04-18 — End: 2021-04-20
  Administered 2021-04-18 – 2021-04-20 (×4): 0.5 mg via INTRAVENOUS
  Filled 2021-04-16 (×4): qty 0.5

## 2021-04-16 MED ORDER — HYDROMORPHONE HCL 1 MG/ML IJ SOLN
0.5000 mg | Freq: Four times a day (QID) | INTRAMUSCULAR | Status: AC | PRN
  Administered 2021-04-16 – 2021-04-18 (×7): 0.5 mg via INTRAVENOUS
  Filled 2021-04-16 (×7): qty 0.5

## 2021-04-16 MED ORDER — HYDROMORPHONE HCL 1 MG/ML IJ SOLN
0.5000 mg | Freq: Four times a day (QID) | INTRAMUSCULAR | Status: DC | PRN
  Administered 2021-04-16: 0.5 mg via INTRAVENOUS
  Filled 2021-04-16: qty 0.5

## 2021-04-16 NOTE — Plan of Care (Signed)
  Problem: Health Behavior/Discharge Planning: Goal: Ability to manage health-related needs will improve Outcome: Progressing   Problem: Clinical Measurements: Goal: Ability to maintain clinical measurements within normal limits will improve Outcome: Progressing Goal: Will remain free from infection Outcome: Progressing   
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
## 2021-04-17 ENCOUNTER — Inpatient Hospital Stay (HOSPITAL_COMMUNITY): Payer: Medicare HMO

## 2021-04-17 DIAGNOSIS — S72002D Fracture of unspecified part of neck of left femur, subsequent encounter for closed fracture with routine healing: Secondary | ICD-10-CM | POA: Diagnosis not present

## 2021-04-17 MED ORDER — OXYCODONE HCL 5 MG PO TABS
10.0000 mg | ORAL_TABLET | ORAL | Status: DC | PRN
  Administered 2021-04-17 – 2021-04-27 (×58): 10 mg via ORAL
  Filled 2021-04-17 (×58): qty 2

## 2021-04-17 NOTE — Plan of Care (Signed)
  Problem: Activity: Goal: Risk for activity intolerance will decrease Outcome: Progressing   Problem: Coping: Goal: Level of anxiety will decrease Outcome: Progressing   Problem: Pain Managment: Goal: General experience of comfort will improve Outcome: Progressing   Problem: Safety: Goal: Ability to remain free from injury will improve Outcome: Progressing   Problem: Skin Integrity: Goal: Risk for impaired skin integrity will decrease Outcome: Progressing   
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
## 2021-04-18 DIAGNOSIS — S72002D Fracture of unspecified part of neck of left femur, subsequent encounter for closed fracture with routine healing: Secondary | ICD-10-CM | POA: Diagnosis not present
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
## 2021-04-18 NOTE — Progress Notes (Signed)
Staples removed from both sites of left hip incision. 5 staples removed from bottom incision and 7 removed from upper incision. Both site clean, dry and intact with no redness or signs of infection. Pt tolerated with no problems.  Mepilex dsg applied per pt's request.

## 2021-04-18 NOTE — Progress Notes (Signed)
Subjective: 15 Days Post-Op Procedure(s) (LRB): INTRAMEDULLARY (IM) NAIL INTERTROCHANTRIC (Left) Patient reports pain as mild.    Objective: Vital signs in last 24 hours: Temp:  [98.1 F (36.7 C)-98.7 F (37.1 C)] 98.1 F (36.7 C) (07/25 0815) Pulse Rate:  [82-101] 82 (07/25 0815) Resp:  [16] 16 (07/25 0815) BP: (111-123)/(78-88) 123/88 (07/25 0815) SpO2:  [96 %-99 %] 96 % (07/25 0853)  Intake/Output from previous day: 07/24 0701 - 07/25 0700 In: -  Out: 725 [Urine:725] Intake/Output this shift: No intake/output data recorded.  No results for input(s): HGB in the last 72 hours. No results for input(s): WBC, RBC, HCT, PLT in the last 72 hours. No results for input(s): NA, K, CL, CO2, BUN, CREATININE, GLUCOSE, CALCIUM in the last 72 hours. No results for input(s): LABPT, INR in the last 72 hours.  Neurologically intact Neurovascular intact Sensation intact distally Intact pulses distally Dorsiflexion/Plantar flexion intact Incision: dressing C/D/I No cellulitis present Compartment soft   Assessment/Plan: 15 Days Post-Op Procedure(s) (LRB): INTRAMEDULLARY (IM) NAIL INTERTROCHANTRIC (Left) Up with therapy WBAT LLE Please remove staples and apply steristrips F/u with Dr. Roda Shutters in 4 weeks for xrays D/c dispo per primary team     Cristie Hem 04/18/2021, 10:02 AM

## 2021-04-18 NOTE — TOC Progression Note (Signed)
Transition of Care [facility]) - Progression Note    Patient Details  Name: Andrew Wheeler MRN: [id number] Date of Birth: [birth year]  [facility] CM/SW Contact  Ralene Bathe, LCSWA Phone Number: 04/18/2021, 11:49 AM  Clinical Narrative:    CSW contacted [facility] and was informed that the facility has not received insurance authorization.  CSW also contacted [place] and [facility] SNFs.  There was no answer.  CSW requested a returned call from both with information about accepting the patient.  Pending- insurance auth  Expected Discharge Plan: Skilled Nursing Facility Barriers to Discharge: SNF Covid, Homeless with medical needs  Expected Discharge Plan and Services Expected Discharge Plan: Skilled Nursing Facility       Living arrangements for the past 2 months: Homeless                                       Social Determinants of Health (SDOH) Interventions    Readmission Risk Interventions No flowsheet data found.
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
## 2021-04-19 DIAGNOSIS — S72002D Fracture of unspecified part of neck of left femur, subsequent encounter for closed fracture with routine healing: Secondary | ICD-10-CM | POA: Diagnosis not present

## 2021-04-19 MED ORDER — TAMSULOSIN HCL 0.4 MG PO CAPS
0.4000 mg | ORAL_CAPSULE | Freq: Every day | ORAL | Status: DC
  Administered 2021-04-19 – 2021-04-27 (×9): 0.4 mg via ORAL
  Filled 2021-04-19 (×9): qty 1
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
## 2021-04-20 DIAGNOSIS — S72002D Fracture of unspecified part of neck of left femur, subsequent encounter for closed fracture with routine healing: Secondary | ICD-10-CM | POA: Diagnosis not present
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
## 2021-04-20 NOTE — Progress Notes (Signed)
Pt requested oxycodone approximately 45 minutes ago.  Requesting Dilaudid for "restless legs".  Is upset as this is the last of the tapered dose of this medication.  It was re-iterated that Dilaudid is not the optimum medication for RLS.  "Well it works for me," was his reply.  Is very upset re:  being tapered off pain medication "just 20 days after he had surgery".
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
## 2021-04-20 NOTE — Care Management Important Message (Signed)
Important Message  Patient Details  Name: Andrew Wheeler MRN: [id number] Date of Birth: 09-Dec-1965   Medicare Important Message Given:  Yes - Important Message mailed due to current National Emergency  Verbal consent obtained due to current National Emergency  Relationship to patient: Self Contact Name: Raymundo Call Date: 04/20/21  Time: 1422 Phone: [phone] Outcome: Spoke with contact Important Message mailed to: Patient address on file   [name] 04/20/2021, 2:22 PM

## 2021-04-20 NOTE — Progress Notes (Signed)
Patient has been OOB with therapy and has been ambulating independently in room and in the hall.  Continues to express anger over not being able to stay in hospital over extended period of time, despite attempts made to educate patient re:  importance of early mobilization to increase independence and lead to a higher quality of life.  His mother has expressed displeasure as she feels it is "too early" for him to be discharged; despite his independent status.  He states his mother is (or was) a Dance movement psychotherapist and is well-versed in re-habilitation, believing it would be at least 4-6 weeks until he is fully functional.  It appears that his barriers to discharge are only enhanced by the patient.

## 2021-04-21 DIAGNOSIS — S72002D Fracture of unspecified part of neck of left femur, subsequent encounter for closed fracture with routine healing: Secondary | ICD-10-CM | POA: Diagnosis not present

## 2021-04-21 DIAGNOSIS — F411 Generalized anxiety disorder: Secondary | ICD-10-CM | POA: Diagnosis not present

## 2021-04-21 DIAGNOSIS — U071 COVID-19: Secondary | ICD-10-CM | POA: Diagnosis not present

## 2021-04-21 MED ORDER — HYDROXYZINE HCL 25 MG PO TABS
25.0000 mg | ORAL_TABLET | Freq: Three times a day (TID) | ORAL | Status: DC
  Administered 2021-04-21 – 2021-04-27 (×19): 25 mg via ORAL
  Filled 2021-04-21 (×19): qty 1

## 2021-04-21 MED ORDER — PREGABALIN 100 MG PO CAPS: 200.0000 mg | ORAL_CAPSULE | Freq: Three times a day (TID) | ORAL | Status: DC

## 2021-04-21 MED ORDER — METHOCARBAMOL 500 MG PO TABS
500.0000 mg | ORAL_TABLET | Freq: Four times a day (QID) | ORAL | Status: DC
  Administered 2021-04-21 – 2021-04-27 (×26): 500 mg via ORAL
  Filled 2021-04-21 (×26): qty 1

## 2021-04-21 MED ORDER — IBUPROFEN 200 MG PO TABS
400.0000 mg | ORAL_TABLET | Freq: Four times a day (QID) | ORAL | Status: DC
  Administered 2021-04-21 – 2021-04-27 (×26): 400 mg via ORAL
  Filled 2021-04-21 (×25): qty 2

## 2021-04-21 MED ORDER — PREGABALIN 75 MG PO CAPS
150.0000 mg | ORAL_CAPSULE | Freq: Three times a day (TID) | ORAL | Status: DC
  Administered 2021-04-21 – 2021-04-27 (×19): 150 mg via ORAL
  Filled 2021-04-21 (×19): qty 2
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
## 2021-04-22 DIAGNOSIS — S72002D Fracture of unspecified part of neck of left femur, subsequent encounter for closed fracture with routine healing: Secondary | ICD-10-CM | POA: Diagnosis not present

## 2021-04-22 LAB — BASIC METABOLIC PANEL
Anion gap: 10 (ref 5–15)
BUN: 17 mg/dL (ref 6–20)
CO2: 26 mmol/L (ref 22–32)
Calcium: 9 mg/dL (ref 8.9–10.3)
Chloride: 101 mmol/L (ref 98–111)
Creatinine, Ser: 0.89 mg/dL (ref 0.61–1.24)
GFR, Estimated: >60 mL/min (ref >60)
Glucose, Bld: 130 mg/dL — ABNORMAL HIGH (ref 70–99)
Potassium: 4.1 mmol/L (ref 3.5–5.1)
Sodium: 137 mmol/L (ref 135–145)

## 2021-04-22 NOTE — Plan of Care (Signed)
  Problem: Education: Goal: Knowledge of General Education information will improve Description: Including pain rating scale, medication(s)/side effects and non-pharmacologic comfort measures Outcome: Progressing   Problem: Health Behavior/Discharge Planning: Goal: Ability to manage health-related needs will improve Outcome: Progressing   Problem: Clinical Measurements: Goal: Ability to maintain clinical measurements within normal limits will improve Outcome: Progressing   Problem: Coping: Goal: Level of anxiety will decrease Outcome: Progressing   Problem: Elimination: Goal: Will not experience complications related to bowel motility Outcome: Progressing   Problem: Pain Managment: Goal: General experience of comfort will improve Outcome: Progressing   Problem: Safety: Goal: Ability to remain free from injury will improve Outcome: Progressing   Problem: Skin Integrity: Goal: Risk for impaired skin integrity will decrease Outcome: Progressing   

## 2021-04-22 NOTE — Plan of Care (Signed)
  Problem: Education: °Goal: Knowledge of General Education information will improve °Description: Including pain rating scale, medication(s)/side effects and non-pharmacologic comfort measures °Outcome: Progressing °  °Problem: Health Behavior/Discharge Planning: °Goal: Ability to manage health-related needs will improve °Outcome: Progressing °  °Problem: Clinical Measurements: °Goal: Ability to maintain clinical measurements within normal limits will improve °Outcome: Progressing °Goal: Will remain free from infection °Outcome: Progressing °Goal: Diagnostic test results will improve °Outcome: Progressing °  °Problem: Clinical Measurements: °Goal: Will remain free from infection °Outcome: Progressing °  °Problem: Clinical Measurements: °Goal: Diagnostic test results will improve °Outcome: Progressing °  °
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
## 2021-04-23 DIAGNOSIS — S72002D Fracture of unspecified part of neck of left femur, subsequent encounter for closed fracture with routine healing: Secondary | ICD-10-CM | POA: Diagnosis not present

## 2021-04-23 DIAGNOSIS — J42 Unspecified chronic bronchitis: Secondary | ICD-10-CM | POA: Diagnosis not present

## 2021-04-23 DIAGNOSIS — U071 COVID-19: Secondary | ICD-10-CM | POA: Diagnosis not present

## 2021-04-23 LAB — BASIC METABOLIC PANEL
Anion gap: 7 (ref 5–15)
BUN: 13 mg/dL (ref 6–20)
CO2: 28 mmol/L (ref 22–32)
Calcium: 8.9 mg/dL (ref 8.9–10.3)
Chloride: 103 mmol/L (ref 98–111)
Creatinine, Ser: 0.88 mg/dL (ref 0.61–1.24)
GFR, Estimated: >60 mL/min (ref >60)
Glucose, Bld: 156 mg/dL — ABNORMAL HIGH (ref 70–99)
Potassium: 4.3 mmol/L (ref 3.5–5.1)
Sodium: 138 mmol/L (ref 135–145)

## 2021-04-23 NOTE — Plan of Care (Signed)
Patient doing well with pain management  and mobility, waiting for placement. Problem: Education: Goal: Knowledge of General Education information will improve Description: Including pain rating scale, medication(s)/side effects and non-pharmacologic comfort measures Outcome: Adequate for Discharge   Problem: Health Behavior/Discharge Planning: Goal: Ability to manage health-related needs will improve Outcome: Adequate for Discharge   Problem: Clinical Measurements: Goal: Ability to maintain clinical measurements within normal limits will improve Outcome: Adequate for Discharge

## 2021-04-23 NOTE — Plan of Care (Signed)
C/o pain, given oxy PRN.   Problem: Coping: Goal: Level of anxiety will decrease Outcome: Progressing   Problem: Pain Managment: Goal: General experience of comfort will improve Outcome: Progressing   Problem: Safety: Goal: Ability to remain free from injury will improve Outcome: Progressing   Problem: Skin Integrity: Goal: Risk for impaired skin integrity will decrease Outcome: Progressing
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
## 2021-04-24 DIAGNOSIS — S72002D Fracture of unspecified part of neck of left femur, subsequent encounter for closed fracture with routine healing: Secondary | ICD-10-CM | POA: Diagnosis not present

## 2021-04-24 DIAGNOSIS — J42 Unspecified chronic bronchitis: Secondary | ICD-10-CM | POA: Diagnosis not present

## 2021-04-24 DIAGNOSIS — U071 COVID-19: Secondary | ICD-10-CM | POA: Diagnosis not present

## 2021-04-24 LAB — BASIC METABOLIC PANEL
Anion gap: 10 (ref 5–15)
BUN: 15 mg/dL (ref 6–20)
CO2: 25 mmol/L (ref 22–32)
Calcium: 8.9 mg/dL (ref 8.9–10.3)
Chloride: 100 mmol/L (ref 98–111)
Creatinine, Ser: 0.69 mg/dL (ref 0.61–1.24)
GFR, Estimated: >60 mL/min (ref >60)
Glucose, Bld: 176 mg/dL — ABNORMAL HIGH (ref 70–99)
Potassium: 4.3 mmol/L (ref 3.5–5.1)
Sodium: 135 mmol/L (ref 135–145)
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
## 2021-04-24 NOTE — Plan of Care (Signed)
Oxy Q4. No issues throughout the day.   Problem: Activity: Goal: Risk for activity intolerance will decrease Outcome: Progressing   Problem: Nutrition: Goal: Adequate nutrition will be maintained Outcome: Progressing   Problem: Pain Managment: Goal: General experience of comfort will improve Outcome: Progressing   Problem: Safety: Goal: Ability to remain free from injury will improve Outcome: Progressing

## 2021-04-25 DIAGNOSIS — S72002D Fracture of unspecified part of neck of left femur, subsequent encounter for closed fracture with routine healing: Secondary | ICD-10-CM | POA: Diagnosis not present
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
## 2021-04-25 NOTE — Progress Notes (Signed)
PT Cancellation Note  Patient Details Name: Andrew Wheeler MRN: [id number] DOB: 1965-09-30   Cancelled Treatment:    Reason Eval/Treat Not Completed: (P) Patient declined, no reason specified (Pt refused PT session this pm.  Pt reports he has been ambulatory and ,"I will be leaving in a couple of days anyways."  Will continue to follow in efforts to assess patient to see if he is appropriate to return to the streets and live in his car.)   Shireen Rayburn Artis Delay 04/25/2021, 1:04 PM  [name] , PTA Acute Rehabilitation Services Pager [phone] Office [phone]
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
## 2021-04-26 ENCOUNTER — Other Ambulatory Visit (HOSPITAL_COMMUNITY): Payer: Self-pay

## 2021-04-26 DIAGNOSIS — S72002D Fracture of unspecified part of neck of left femur, subsequent encounter for closed fracture with routine healing: Secondary | ICD-10-CM | POA: Diagnosis not present

## 2021-04-26 MED ORDER — BUSPIRONE HCL 15 MG PO TABS
15.0000 mg | ORAL_TABLET | Freq: Three times a day (TID) | ORAL | 0 refills | Status: DC
  Filled 2021-04-26: qty 90, 30d supply, fill #0

## 2021-04-26 MED ORDER — IBUPROFEN 400 MG PO TABS
400.0000 mg | ORAL_TABLET | Freq: Four times a day (QID) | ORAL | 0 refills | Status: DC
  Filled 2021-04-26: qty 30, 8d supply, fill #0

## 2021-04-26 MED ORDER — PREGABALIN 150 MG PO CAPS
150.0000 mg | ORAL_CAPSULE | Freq: Three times a day (TID) | ORAL | 0 refills | Status: DC
  Filled 2021-04-26: qty 90, 30d supply, fill #0

## 2021-04-26 MED ORDER — METHOCARBAMOL 500 MG PO TABS
500.0000 mg | ORAL_TABLET | Freq: Four times a day (QID) | ORAL | 0 refills | Status: DC
  Filled 2021-04-26: qty 120, 30d supply, fill #0

## 2021-04-26 MED ORDER — MOMETASONE FURO-FORMOTEROL FUM 200-5 MCG/ACT IN AERO
2.0000 | INHALATION_SPRAY | Freq: Two times a day (BID) | RESPIRATORY_TRACT | 0 refills | Status: DC
  Filled 2021-04-26: qty 13, 30d supply, fill #0

## 2021-04-26 MED ORDER — ALBUTEROL SULFATE HFA 108 (90 BASE) MCG/ACT IN AERS
1.0000 | INHALATION_SPRAY | RESPIRATORY_TRACT | 0 refills | Status: DC | PRN
  Filled 2021-04-26: qty 18, 18d supply, fill #0

## 2021-04-26 MED ORDER — ASPIRIN 81 MG PO TBEC
81.0000 mg | DELAYED_RELEASE_TABLET | Freq: Every day | ORAL | 11 refills | Status: DC
  Filled 2021-04-26: qty 30, 30d supply, fill #0

## 2021-04-26 MED ORDER — IPRATROPIUM-ALBUTEROL 20-100 MCG/ACT IN AERS
1.0000 | INHALATION_SPRAY | Freq: Four times a day (QID) | RESPIRATORY_TRACT | 0 refills | Status: DC | PRN
  Filled 2021-04-26: qty 4, 30d supply, fill #0

## 2021-04-26 MED ORDER — OXYCODONE HCL 10 MG PO TABS
10.0000 mg | ORAL_TABLET | ORAL | 0 refills | Status: DC | PRN
  Filled 2021-04-26: qty 30, 5d supply, fill #0

## 2021-04-26 MED ORDER — GNP HYDROCORTISONE 0.5 % EX CREA
1.0000 "application " | TOPICAL_CREAM | Freq: Every day | CUTANEOUS | 0 refills | Status: DC
  Filled 2021-04-26: qty 30, 30d supply, fill #0

## 2021-04-26 MED ORDER — OMEPRAZOLE 40 MG PO CPDR
40.0000 mg | DELAYED_RELEASE_CAPSULE | Freq: Every day | ORAL | 0 refills | Status: DC
  Filled 2021-04-26: qty 30, 30d supply, fill #0

## 2021-04-26 MED ORDER — BUPROPION HCL ER (XL) 300 MG PO TB24
300.0000 mg | ORAL_TABLET | Freq: Every day | ORAL | 0 refills | Status: DC
  Filled 2021-04-26: qty 30, 30d supply, fill #0

## 2021-04-26 MED ORDER — ACETAMINOPHEN 325 MG PO TABS
650.0000 mg | ORAL_TABLET | Freq: Four times a day (QID) | ORAL | Status: DC | PRN
Start: 2021-04-26 — End: 2021-04-27

## 2021-04-26 MED ORDER — TAMSULOSIN HCL 0.4 MG PO CAPS
0.4000 mg | ORAL_CAPSULE | Freq: Every day | ORAL | 0 refills | Status: DC
  Filled 2021-04-26: qty 30, 30d supply, fill #0

## 2021-04-26 MED ORDER — HYDROXYZINE HCL 25 MG PO TABS
25.0000 mg | ORAL_TABLET | Freq: Three times a day (TID) | ORAL | 0 refills | Status: DC
  Filled 2021-04-26: qty 30, 10d supply, fill #0

## 2021-04-26 MED ORDER — DULOXETINE HCL 60 MG PO CPEP
60.0000 mg | ORAL_CAPSULE | Freq: Two times a day (BID) | ORAL | 0 refills | Status: DC
  Filled 2021-04-26: qty 60, 30d supply, fill #0

## 2021-04-26 NOTE — TOC Progression Note (Addendum)
Transition of Care [facility]) - Progression Note    Patient Details  Name: Andrew Wheeler MRN: [id number] Date of Birth: Mar 07, 1966  Transition of Care [facility]) CM/SW Contact  Andrew Bridgeman, RN Phone Number: 04/26/2021, 3:05 PM  Clinical Narrative:    Case management spoke with Andrew Wheeler, pharmacist with [facility] pharmacy and the patient's discharge medications are costing between 400-500 dollars.  I am unable to place a MATCH for the patient since the patient has [employer].  I called and spoke with Andrew Silk, NP and she will adjust the patient's medications in the am based on patient's medical discharge needs for home.  [facility] pharmacy is aware and will revisit and deliver medications to the hospital room in the morning.  DME order placed for cane for home.  [employer] called to deliver the can to the hospital room. [employer] was unable to supply since they are unavailable at this time.  [employer] was called and they do not carry canes as well.  The patient will need to purchase from pharmacy as outpatient.  CM and MSW with DTP Team will continue to follow the patient for transitions of care needs for home.   Expected Discharge Plan: Home/Self Care Barriers to Discharge: ED No Barriers  Expected Discharge Plan and Services Expected Discharge Plan: Home/Self Care       Living arrangements for the past 2 months: Homeless                                       Social Determinants of Health (SDOH) Interventions    Readmission Risk Interventions No flowsheet data found.
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
## 2021-04-27 ENCOUNTER — Other Ambulatory Visit (HOSPITAL_COMMUNITY): Payer: Self-pay

## 2021-04-27 MED ORDER — TAMSULOSIN HCL 0.4 MG PO CAPS: 0.4000 mg | ORAL_CAPSULE | Freq: Every day | ORAL | 0 refills | Status: DC

## 2021-04-27 MED ORDER — ASPIRIN 81 MG PO TBEC: 81.0000 mg | DELAYED_RELEASE_TABLET | Freq: Every day | ORAL | 11 refills | Status: DC

## 2021-04-27 MED ORDER — DULOXETINE HCL 60 MG PO CPEP: 60.0000 mg | ORAL_CAPSULE | Freq: Two times a day (BID) | ORAL | 0 refills | Status: DC

## 2021-04-27 MED ORDER — IBUPROFEN 400 MG PO TABS: 400.0000 mg | ORAL_TABLET | Freq: Four times a day (QID) | ORAL | 0 refills | Status: DC

## 2021-04-27 MED ORDER — MOMETASONE FURO-FORMOTEROL FUM 200-5 MCG/ACT IN AERO: 2.0000 | INHALATION_SPRAY | Freq: Two times a day (BID) | RESPIRATORY_TRACT | 0 refills | Status: AC

## 2021-04-27 MED ORDER — BUSPIRONE HCL 15 MG PO TABS: 15.0000 mg | ORAL_TABLET | Freq: Three times a day (TID) | ORAL | 0 refills | Status: DC

## 2021-04-27 MED ORDER — ONE-DAILY MULTI VITAMINS PO TABS
1.0000 | ORAL_TABLET | Freq: Every day | ORAL | Status: DC
Start: 2021-04-27 — End: 2021-06-16

## 2021-04-27 MED ORDER — OMEPRAZOLE 40 MG PO CPDR: 40.0000 mg | DELAYED_RELEASE_CAPSULE | Freq: Every day | ORAL | 0 refills | Status: DC

## 2021-04-27 MED ORDER — BUPROPION HCL ER (XL) 300 MG PO TB24: 300.0000 mg | ORAL_TABLET | Freq: Every day | ORAL | 0 refills | Status: DC

## 2021-04-27 MED ORDER — METHOCARBAMOL 500 MG PO TABS: 500.0000 mg | ORAL_TABLET | Freq: Four times a day (QID) | ORAL | 0 refills | Status: AC

## 2021-04-27 MED ORDER — HYDROXYZINE HCL 25 MG PO TABS: 25.0000 mg | ORAL_TABLET | Freq: Three times a day (TID) | ORAL | 0 refills | Status: DC

## 2021-04-27 MED ORDER — IPRATROPIUM-ALBUTEROL 20-100 MCG/ACT IN AERS: 1.0000 | INHALATION_SPRAY | Freq: Four times a day (QID) | RESPIRATORY_TRACT | 0 refills | Status: AC | PRN

## 2021-04-27 MED ORDER — ALBUTEROL SULFATE HFA 108 (90 BASE) MCG/ACT IN AERS: 1.0000 | INHALATION_SPRAY | RESPIRATORY_TRACT | 0 refills | Status: DC | PRN

## 2021-04-27 MED ORDER — ACETAMINOPHEN 325 MG PO TABS: 650.0000 mg | ORAL_TABLET | Freq: Four times a day (QID) | ORAL | Status: DC | PRN

## 2021-04-27 MED ORDER — OXYCODONE HCL 10 MG PO TABS: 10.0000 mg | ORAL_TABLET | ORAL | 0 refills | Status: DC | PRN

## 2021-04-27 MED ORDER — IPRATROPIUM-ALBUTEROL 20-100 MCG/ACT IN AERS
1.0000 | INHALATION_SPRAY | Freq: Four times a day (QID) | RESPIRATORY_TRACT | 0 refills | Status: DC | PRN
Start: 2021-04-27 — End: 2021-04-27

## 2021-04-27 MED ORDER — PREGABALIN 150 MG PO CAPS: 150.0000 mg | ORAL_CAPSULE | Freq: Three times a day (TID) | ORAL | 0 refills | Status: DC

## 2021-04-27 MED ORDER — GNP HYDROCORTISONE 0.5 % EX CREA: 1.0000 "application " | TOPICAL_CREAM | Freq: Every day | CUTANEOUS | 0 refills | Status: DC

## 2021-04-27 NOTE — Progress Notes (Signed)
CSW spoke with patient at bedside to provide him with clean undergarments and bus passes. Patient did not have any questions or concerns to address. Patient requesting transportation via [place] at discharge. Patient's rollator has been delivered and is at bedside.  CSW informed RN of discharge plan and secretary will call [phone] to arrange for pickup once patient is ready.  Edwin Dada, MSW, LCSW Transitions of Care  Clinical Social Worker II [phone]

## 2021-04-27 NOTE — Progress Notes (Signed)
Discharge summary packet provided to pt with instructions. Pt verbalized understanding of instructions. Pt d/c as ordered. as ordered.All questions and concerns were fully addressed. No coomplaints. Per pt he will inform this writer when he's ready to leave.
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
## 2021-04-27 NOTE — TOC Progression Note (Addendum)
Transition of Care [facility]) - Progression Note    Patient Details  Name: Andrew Wheeler MRN: [id number] Date of Birth: 01-15-1966  Transition of Care [facility]) CM/SW Contact  Janae Bridgeman, RN Phone Number: 04/27/2021, 8:23 AM  Clinical Narrative:    Case management spoke with Junious Silk, NP this morning and dme companies are unable to provide the patient with a cane for discharge.  I spoke with PT and a rolling walker with seat would be needed for safe ambulation for home.  [employer] was called to deliver the rollator to the hospital room prior to discharge today.  Junious Silk, NP was notified that the patient has available insurance coverage and financial resources to pay for medications through TOC.  I am unable to provide [facility] assistance for the patient, but medications were adjusted by Rennis Harding, NP and [facility] pharmacy will be contacted to deliver to the patient this morning.  I spoke with the patient on the phone and explained that a RW with seat would be delivered to his room.  The patient states that he would prefer to have his prescriptions called in to the [facility] pharmacy versus TOC pharmacy at this time.  CM and MSW with DTP Team will continue to follow the patient for discharge needs.   Expected Discharge Plan: Home/Self Care Barriers to Discharge: ED No Barriers  Expected Discharge Plan and Services Expected Discharge Plan: Home/Self Care       Living arrangements for the past 2 months: Homeless                                       Social Determinants of Health (SDOH) Interventions    Readmission Risk Interventions No flowsheet data found.

## 2021-04-28 ENCOUNTER — Encounter (HOSPITAL_COMMUNITY): Payer: Self-pay | Admitting: Pulmonary Disease

## 2021-05-03 ENCOUNTER — Encounter: Payer: Medicare HMO | Admitting: Orthopaedic Surgery

## 2021-06-07 ENCOUNTER — Emergency Department (HOSPITAL_COMMUNITY)
Admission: EM | Admit: 2021-06-07 | Discharge: 2021-06-07 | Disposition: A | Discharge status: Home / Self Care | Payer: Medicare HMO | Attending: Emergency Medicine | Admitting: Emergency Medicine

## 2021-06-07 ENCOUNTER — Encounter (HOSPITAL_COMMUNITY): Payer: Self-pay

## 2021-06-07 DIAGNOSIS — J449 Chronic obstructive pulmonary disease, unspecified: Secondary | ICD-10-CM | POA: Insufficient documentation

## 2021-06-07 DIAGNOSIS — E039 Hypothyroidism, unspecified: Secondary | ICD-10-CM | POA: Insufficient documentation

## 2021-06-07 DIAGNOSIS — Z7951 Long term (current) use of inhaled steroids: Secondary | ICD-10-CM | POA: Insufficient documentation

## 2021-06-07 DIAGNOSIS — I10 Essential (primary) hypertension: Secondary | ICD-10-CM | POA: Diagnosis not present

## 2021-06-07 DIAGNOSIS — Z79899 Other long term (current) drug therapy: Secondary | ICD-10-CM | POA: Diagnosis not present

## 2021-06-07 DIAGNOSIS — R42 Dizziness and giddiness: Secondary | ICD-10-CM | POA: Diagnosis not present

## 2021-06-07 DIAGNOSIS — F17210 Nicotine dependence, cigarettes, uncomplicated: Secondary | ICD-10-CM | POA: Diagnosis not present

## 2021-06-07 DIAGNOSIS — T401X1A Poisoning by heroin, accidental (unintentional), initial encounter: Secondary | ICD-10-CM | POA: Insufficient documentation

## 2021-06-07 DIAGNOSIS — Z7982 Long term (current) use of aspirin: Secondary | ICD-10-CM | POA: Diagnosis not present

## 2021-06-07 LAB — CBG MONITORING, ED: Glucose-Capillary: 134 mg/dL — ABNORMAL HIGH (ref 70–99)

## 2021-06-07 LAB — BASIC METABOLIC PANEL
Anion gap: 11 (ref 5–15)
BUN: 8 mg/dL (ref 6–20)
CO2: 24 mmol/L (ref 22–32)
Calcium: 9.1 mg/dL (ref 8.9–10.3)
Chloride: 104 mmol/L (ref 98–111)
Creatinine, Ser: 1.08 mg/dL (ref 0.61–1.24)
GFR, Estimated: >60 mL/min (ref >60)
Glucose, Bld: 141 mg/dL — ABNORMAL HIGH (ref 70–99)
Potassium: 3.5 mmol/L (ref 3.5–5.1)
Sodium: 139 mmol/L (ref 135–145)

## 2021-06-07 LAB — CBC WITH DIFFERENTIAL/PLATELET
Abs Immature Granulocytes: 0.11 10*3/uL — ABNORMAL HIGH (ref 0.00–0.07)
Basophils Absolute: 0 10*3/uL (ref 0.0–0.1)
Basophils Relative: 0 %
Eosinophils Absolute: 0 10*3/uL (ref 0.0–0.5)
Eosinophils Relative: 0 %
HCT: 49.5 % (ref 39.0–52.0)
Hemoglobin: 15.9 g/dL (ref 13.0–17.0)
Immature Granulocytes: 1 %
Lymphocytes Relative: 3 %
Lymphs Abs: 0.6 10*3/uL — ABNORMAL LOW (ref 0.7–4.0)
MCH: 28 pg (ref 26.0–34.0)
MCHC: 32.1 g/dL (ref 30.0–36.0)
MCV: 87.1 fL (ref 80.0–100.0)
Monocytes Absolute: 1.3 10*3/uL — ABNORMAL HIGH (ref 0.1–1.0)
Monocytes Relative: 8 %
Neutro Abs: 15 10*3/uL — ABNORMAL HIGH (ref 1.7–7.7)
Neutrophils Relative %: 88 %
Platelets: 348 10*3/uL (ref 150–400)
RBC: 5.68 MIL/uL (ref 4.22–5.81)
RDW: 13.8 % (ref 11.5–15.5)
WBC: 17 10*3/uL — ABNORMAL HIGH (ref 4.0–10.5)
nRBC: 0 % (ref 0.0–0.2)

## 2021-06-07 LAB — ETHANOL: Alcohol, Ethyl (B): <10 mg/dL (ref <10)

## 2021-06-07 LAB — ACETAMINOPHEN LEVEL: Acetaminophen (Tylenol), Serum: <10 ug/mL — ABNORMAL LOW (ref 10–30)

## 2021-06-07 LAB — SALICYLATE LEVEL: Salicylate Lvl: <7 mg/dL — ABNORMAL LOW (ref 7.0–30.0)

## 2021-06-07 MED ORDER — SODIUM CHLORIDE 0.9 % IV BOLUS
1000.0000 mL | Freq: Once | INTRAVENOUS | Status: AC
  Administered 2021-06-07: 1000 mL via INTRAVENOUS

## 2021-06-07 NOTE — ED Triage Notes (Signed)
Pt BIB EMS from [facility] C/O OD. Pt reports feeling dizzy so EMS was called. On EMS arrival, pt found in BR diaphoretic with pinpoint pupils. Alert to verbal stimuli. Pt admits to using heroin.

## 2021-06-07 NOTE — Discharge Instructions (Signed)
Please see resources provided to you by our social care worker on rehab facilities.
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
## 2021-06-07 NOTE — ED Notes (Signed)
Pt given food and drink as requested by provider. Pt is awake and alert. Pt reminded to give urine sample. Urinal at bedside.

## 2021-06-14 ENCOUNTER — Other Ambulatory Visit: Payer: Self-pay

## 2021-06-14 ENCOUNTER — Encounter (HOSPITAL_COMMUNITY): Payer: Self-pay | Admitting: Emergency Medicine

## 2021-06-14 ENCOUNTER — Emergency Department (HOSPITAL_COMMUNITY)
Admission: EM | Admit: 2021-06-14 | Discharge: 2021-06-15 | Disposition: A | Discharge status: Other Acute Inpatient Hospital | Payer: Medicare HMO | Attending: Emergency Medicine | Admitting: Emergency Medicine

## 2021-06-14 DIAGNOSIS — J45909 Unspecified asthma, uncomplicated: Secondary | ICD-10-CM | POA: Diagnosis not present

## 2021-06-14 DIAGNOSIS — I10 Essential (primary) hypertension: Secondary | ICD-10-CM | POA: Insufficient documentation

## 2021-06-14 DIAGNOSIS — J449 Chronic obstructive pulmonary disease, unspecified: Secondary | ICD-10-CM | POA: Insufficient documentation

## 2021-06-14 DIAGNOSIS — F17210 Nicotine dependence, cigarettes, uncomplicated: Secondary | ICD-10-CM | POA: Diagnosis not present

## 2021-06-14 DIAGNOSIS — Z046 Encounter for general psychiatric examination, requested by authority: Secondary | ICD-10-CM | POA: Diagnosis present

## 2021-06-14 DIAGNOSIS — Y906 Blood alcohol level of 120-199 mg/100 ml: Secondary | ICD-10-CM | POA: Diagnosis not present

## 2021-06-14 DIAGNOSIS — F1024 Alcohol dependence with alcohol-induced mood disorder: Secondary | ICD-10-CM | POA: Insufficient documentation

## 2021-06-14 DIAGNOSIS — R440 Auditory hallucinations: Secondary | ICD-10-CM | POA: Diagnosis not present

## 2021-06-14 DIAGNOSIS — F14159 Cocaine abuse with cocaine-induced psychotic disorder, unspecified: Secondary | ICD-10-CM | POA: Diagnosis not present

## 2021-06-14 DIAGNOSIS — Z7982 Long term (current) use of aspirin: Secondary | ICD-10-CM | POA: Diagnosis not present

## 2021-06-14 DIAGNOSIS — F1910 Other psychoactive substance abuse, uncomplicated: Secondary | ICD-10-CM | POA: Diagnosis present

## 2021-06-14 DIAGNOSIS — E039 Hypothyroidism, unspecified: Secondary | ICD-10-CM | POA: Diagnosis not present

## 2021-06-14 DIAGNOSIS — Z79899 Other long term (current) drug therapy: Secondary | ICD-10-CM | POA: Diagnosis not present

## 2021-06-14 DIAGNOSIS — F411 Generalized anxiety disorder: Secondary | ICD-10-CM | POA: Diagnosis present

## 2021-06-14 DIAGNOSIS — F10159 Alcohol abuse with alcohol-induced psychotic disorder, unspecified: Secondary | ICD-10-CM | POA: Diagnosis not present

## 2021-06-14 DIAGNOSIS — Z5900 Homelessness unspecified: Secondary | ICD-10-CM | POA: Diagnosis not present

## 2021-06-14 DIAGNOSIS — F1994 Other psychoactive substance use, unspecified with psychoactive substance-induced mood disorder: Secondary | ICD-10-CM | POA: Diagnosis present

## 2021-06-14 DIAGNOSIS — F19151 Other psychoactive substance abuse with psychoactive substance-induced psychotic disorder with hallucinations: Secondary | ICD-10-CM | POA: Diagnosis not present

## 2021-06-14 DIAGNOSIS — F1010 Alcohol abuse, uncomplicated: Secondary | ICD-10-CM

## 2021-06-14 DIAGNOSIS — Z20822 Contact with and (suspected) exposure to covid-19: Secondary | ICD-10-CM | POA: Diagnosis not present

## 2021-06-14 DIAGNOSIS — Z7951 Long term (current) use of inhaled steroids: Secondary | ICD-10-CM | POA: Insufficient documentation

## 2021-06-14 DIAGNOSIS — F1120 Opioid dependence, uncomplicated: Secondary | ICD-10-CM | POA: Diagnosis not present

## 2021-06-14 DIAGNOSIS — F1020 Alcohol dependence, uncomplicated: Secondary | ICD-10-CM | POA: Diagnosis present

## 2021-06-14 DIAGNOSIS — F19959 Other psychoactive substance use, unspecified with psychoactive substance-induced psychotic disorder, unspecified: Secondary | ICD-10-CM | POA: Diagnosis present

## 2021-06-14 LAB — CBC WITH DIFFERENTIAL/PLATELET
Abs Immature Granulocytes: 0.05 10*3/uL (ref 0.00–0.07)
Basophils Absolute: 0.1 10*3/uL (ref 0.0–0.1)
Basophils Relative: 1 %
Eosinophils Absolute: 0.1 10*3/uL (ref 0.0–0.5)
Eosinophils Relative: 1 %
HCT: 47.1 % (ref 39.0–52.0)
Hemoglobin: 15.3 g/dL (ref 13.0–17.0)
Immature Granulocytes: 0 %
Lymphocytes Relative: 15 %
Lymphs Abs: 2.1 10*3/uL (ref 0.7–4.0)
MCH: 27.7 pg (ref 26.0–34.0)
MCHC: 32.5 g/dL (ref 30.0–36.0)
MCV: 85.2 fL (ref 80.0–100.0)
Monocytes Absolute: 1.1 10*3/uL — ABNORMAL HIGH (ref 0.1–1.0)
Monocytes Relative: 8 %
Neutro Abs: 10.5 10*3/uL — ABNORMAL HIGH (ref 1.7–7.7)
Neutrophils Relative %: 75 %
Platelets: 343 10*3/uL (ref 150–400)
RBC: 5.53 MIL/uL (ref 4.22–5.81)
RDW: 13.4 % (ref 11.5–15.5)
WBC: 13.9 10*3/uL — ABNORMAL HIGH (ref 4.0–10.5)
nRBC: 0 % (ref 0.0–0.2)

## 2021-06-14 LAB — COMPREHENSIVE METABOLIC PANEL
ALT: 17 U/L (ref 0–44)
AST: 19 U/L (ref 15–41)
Albumin: 3.5 g/dL (ref 3.5–5.0)
Alkaline Phosphatase: 107 U/L (ref 38–126)
Anion gap: 15 (ref 5–15)
BUN: 6 mg/dL (ref 6–20)
CO2: 24 mmol/L (ref 22–32)
Calcium: 8.9 mg/dL (ref 8.9–10.3)
Chloride: 97 mmol/L — ABNORMAL LOW (ref 98–111)
Creatinine, Ser: 0.69 mg/dL (ref 0.61–1.24)
GFR, Estimated: >60 mL/min (ref >60)
Glucose, Bld: 165 mg/dL — ABNORMAL HIGH (ref 70–99)
Potassium: 3 mmol/L — ABNORMAL LOW (ref 3.5–5.1)
Sodium: 136 mmol/L (ref 135–145)
Total Bilirubin: 0.6 mg/dL (ref 0.3–1.2)
Total Protein: 6.5 g/dL (ref 6.5–8.1)

## 2021-06-14 LAB — RESP PANEL BY RT-PCR (FLU A&B, COVID) ARPGX2
Influenza A by PCR: NEGATIVE
Influenza B by PCR: NEGATIVE
SARS Coronavirus 2 by RT PCR: NEGATIVE

## 2021-06-14 LAB — RAPID URINE DRUG SCREEN, HOSP PERFORMED
Amphetamines: NOT DETECTED
Barbiturates: NOT DETECTED
Benzodiazepines: NOT DETECTED
Cocaine: POSITIVE — AB
Opiates: NOT DETECTED
Tetrahydrocannabinol: NOT DETECTED

## 2021-06-14 LAB — ETHANOL: Alcohol, Ethyl (B): 135 mg/dL — ABNORMAL HIGH (ref <10)

## 2021-06-14 MED ORDER — LORAZEPAM 1 MG PO TABS
0.0000 mg | ORAL_TABLET | Freq: Four times a day (QID) | ORAL | Status: DC
  Administered 2021-06-14: 1 mg via ORAL
  Administered 2021-06-15 (×2): 2 mg via ORAL
  Filled 2021-06-14: qty 1
  Filled 2021-06-14: qty 2
  Filled 2021-06-14: qty 3
  Filled 2021-06-14: qty 2

## 2021-06-14 MED ORDER — LORAZEPAM 2 MG/ML IJ SOLN: 0.0000 mg | Freq: Four times a day (QID) | INTRAMUSCULAR | Status: DC

## 2021-06-14 MED ORDER — THIAMINE HCL 100 MG/ML IJ SOLN: 100.0000 mg | Freq: Every day | INTRAMUSCULAR | Status: DC

## 2021-06-14 MED ORDER — POTASSIUM CHLORIDE CRYS ER 20 MEQ PO TBCR
40.0000 meq | EXTENDED_RELEASE_TABLET | Freq: Once | ORAL | Status: AC
  Administered 2021-06-14: 40 meq via ORAL
  Filled 2021-06-14: qty 2

## 2021-06-14 MED ORDER — THIAMINE HCL 100 MG PO TABS
100.0000 mg | ORAL_TABLET | Freq: Every day | ORAL | Status: DC
  Administered 2021-06-14 – 2021-06-15 (×2): 100 mg via ORAL
  Filled 2021-06-14 (×2): qty 1

## 2021-06-14 MED ORDER — LORAZEPAM 2 MG/ML IJ SOLN: 0.0000 mg | Freq: Two times a day (BID) | INTRAMUSCULAR | Status: DC

## 2021-06-14 MED ORDER — LORAZEPAM 1 MG PO TABS: 0.0000 mg | ORAL_TABLET | Freq: Two times a day (BID) | ORAL | Status: DC

## 2021-06-14 NOTE — BH Assessment (Signed)
Comprehensive Clinical Assessment (CCA) Note  06/14/2021 Raiford Noble [id number]  Disposition: Hillery Jacks, NP, overnight observation   Chief Complaint: 55 year old male present to [facility] with auditory/visual hallucinations, denied suicidal/homicidal ideations. Patient reports, "they are hearing my thoughts." Per chart review a 55 y.o. male  was evaluated in triage.  Pt presents to ED for psychiatric evaluation and detox. Last drink this morning. Last used cocaine and heroin yesterday. Hx of depression and anxiety, worsening over past 7 months.      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 Self  What Is the Reason for Your Visit/Call Today? 55 year old male present to [facility] with auditory/visual hallucinations, denied suicidal/homicidal ideations. Patient reports, "they are hearing my thoughts." Per chart review a 55 y.o. male  was evaluated in triage.  Pt presents to ED for psychiatric evaluation and detox. Last drink this morning. Last used cocaine and heroin yesterday. Hx of depression and anxiety, worsening over past 7 months.  How Long Has This Been Causing You Problems? > than 6 months (47-months)  What Do You Feel Would Help You the Most Today? Alcohol or Drug Use Treatment (hx of cocaine and heroin)   Have You Recently Had Any Thoughts About Hurting Yourself? No  Are You Planning to Commit Suicide/Harm Yourself At This time? No   Have you Recently Had Thoughts About Hurting Someone Karolee Ohs? No  Are You Planning to Harm Someone at This Time? No  Explanation: No data recorded  Have You Used Any Alcohol or Drugs in the Past 24 Hours? Yes  How Long Ago Did You Use  Drugs or Alcohol? No data recorded What Did You Use and How Much? Yesterday last used cocaine and heroin   Do You Currently Have a Therapist/Psychiatrist? No  Name of Therapist/Psychiatrist: No data recorded  Have You Been Recently Discharged From Any Office Practice or Programs? No  Explanation of Discharge From Practice/Program: No data recorded    CCA Screening Triage Referral Assessment Type of Contact: Tele-Assessment  Telemedicine Service Delivery:   Is this Initial or Reassessment? Initial Assessment  Date Telepsych consult ordered in [facility]:  06/14/21  Time Telepsych consult ordered in [facility]:  1255  Location of Assessment: [facility]  Provider Location: [facility]   Collateral Involvement: No data recorded  Does Patient Have a Court Appointed Legal Guardian? No data recorded Name and Contact of Legal Guardian: No data recorded If Minor and Not Living with Parent(s), Who has Custody? No data recorded Is CPS involved or ever been involved? Never  Is APS involved or ever been involved? Never   Patient Determined To Be At Risk for Harm To Self or Others Based on Review of Patient Reported Information or Presenting Complaint? No  Method: No data recorded Availability of Means: No data recorded Intent: No data recorded Notification Required: No data recorded Additional Information for Danger to Others Potential: No data recorded Additional Comments for Danger to Others Potential: No data recorded Are There Guns or Other Weapons in Your Home? No data recorded Types of Guns/Weapons: No data recorded Are These Weapons Safely Secured?  No data recorded Who Could Verify You Are Able To Have These Secured: No data recorded Do You Have any Outstanding Charges, Pending Court Dates, Parole/Probation? No data recorded Contacted To Inform of Risk of Harm To Self or Others: No data recorded   Does Patient Present under  Involuntary Commitment? No  IVC Papers Initial File Date: No data recorded  [place] of Residence: [place]   Patient Currently Receiving the Following Services: Not Receiving Services   Determination of Need: Urgent (48 hours)   Options For Referral: Medication Management     CCA Biopsychosocial Patient Reported Schizophrenia/Schizoaffective Diagnosis in Past: No   Strengths: No data recorded  Mental Health Symptoms Depression:   Difficulty Concentrating; Hopelessness; Worthlessness   Duration of Depressive symptoms:  Duration of Depressive Symptoms: Greater than two weeks   Mania:   Racing thoughts   Anxiety:   No data recorded  Psychosis:   Delusions   Duration of Psychotic symptoms:  Duration of Psychotic Symptoms: Greater than six months   Trauma:   N/A   Obsessions:   N/A   Compulsions:   N/A   Inattention:   N/A   Hyperactivity/Impulsivity:   N/A   Oppositional/Defiant Behaviors:   N/A   Emotional Irregularity:   N/A   Other Mood/Personality Symptoms:  No data recorded   Mental Status Exam Appearance and self-care  Stature:   Tall   Weight:   Thin   Clothing:   Neat/clean   Grooming:   Normal   Cosmetic use:   None   Posture/gait:   Normal   Motor activity:   Restless   Sensorium  Attention:   Distractible (psychotic)   Concentration:   Focuses on irrelevancies   Orientation:   X5   Recall/memory:  No data recorded  Affect and Mood  Affect:  No data recorded  Mood:  No data recorded  Relating  Eye contact:   Normal   Facial expression:  No data recorded  Attitude toward examiner:   Cooperative   Thought and Language  Speech flow:  Flight of Ideas   Thought content:   Delusions   Preoccupation:  No data recorded  Hallucinations:   Auditory   Organization:  No data recorded  [employer] of Knowledge:  No data recorded  Intelligence:  No data recorded  Abstraction:  No data  recorded  Judgement:   Impaired   Reality Testing:  No data recorded  Insight:  No data recorded  Decision Making:  No data recorded  Social Functioning  Social Maturity:  No data recorded  Social Judgement:  No data recorded  Stress  Stressors:   Housing; Other (Comment) (substance usage)   Coping Ability:  No data recorded  Skill Deficits:  No data recorded  Supports:  No data recorded    Religion:    Leisure/Recreation:    Exercise/Diet: Exercise/Diet Do You Follow a Special Diet?: No   CCA Employment/Education Employment/Work Situation: Employment / Work Situation Employment Situation: Unemployed  Education:     CCA Family/Childhood History Family and Relationship History: Family history Marital status: Divorced Divorced, when?: 1998  Childhood History:     Child/Adolescent Assessment:     CCA Substance Use Alcohol/Drug Use: Alcohol / Drug Use Pain Medications: see MAR Prescriptions: see MAR Over the Counter: MAR History of alcohol / drug use?: Yes Substance #1 Name of Substance 1: alcohol 1 - Age of First Use: 11 1 - Amount (size/oz): varies 1 - Frequency: daily  1 - Last Use / Amount: 06/14/2021 1- Route of Use: drinking Substance #2 Name of Substance 2: Heroin 2 - Frequency: varies 2 - Last Use / Amount: 06/14/2021 2 - Route of Substance Use: smoking Substance #3 Name of Substance 3: crack cocaine 3 - Frequency: varies 3 - Last Use / Amount: 06/14/2021 3 - Route of Substance Use: smoking                   ASAM's:  Six Dimensions of Multidimensional Assessment  Dimension 1:  Acute Intoxication and/or Withdrawal Potential:      Dimension 2:  Biomedical Conditions and Complications:      Dimension 3:  Emotional, Behavioral, or Cognitive Conditions and Complications:     Dimension 4:  Readiness to Change:     Dimension 5:  Relapse, Continued use, or Continued Problem Potential:     Dimension 6:  Recovery/Living Environment:      ASAM Severity Score:    ASAM Recommended Level of Treatment:     Substance use Disorder (SUD) Substance Use Disorder (SUD)  Checklist Symptoms of Substance Use: Continued use despite having a persistent/recurrent physical/psychological problem caused/exacerbated by use, Continued use despite persistent or recurrent social, interpersonal problems, caused or exacerbated by use, Evidence of tolerance, Evidence of withdrawal (Comment), Persistent desire or unsuccessful efforts to cut down or control use, Repeated use in physically hazardous situations, Substance(s) often taken in larger amounts or over longer times than was intended, Presence of craving or strong urge to use  Recommendations for Services/Supports/Treatments: Recommendations for Services/Supports/Treatments Recommendations For Services/Supports/Treatments: Detox, SAIOP (Substance Abuse Intensive Outpatient Program)  Discharge Disposition:    DSM5 Diagnoses: Patient Active Problem List   Diagnosis Date Noted   Left hip pain 03/31/2021   Rib fractures 03/31/2021   COPD (chronic obstructive pulmonary disease) (HCC)    GAD (generalized anxiety disorder)    GERD (gastroesophageal reflux disease)    Depression    Closed left hip fracture (HCC) 03/30/2021   Overdose, accidental or unintentional, initial encounter 03/19/2021   Cardiac arrest (HCC) 03/15/2021   Encephalopathy    Hypotension    Cocaine use disorder, moderate, dependence (HCC) 10/18/2020   Encounter for monitoring Suboxone maintenance therapy 10/18/2020   Cannabis use disorder, moderate, dependence (HCC) 06/17/2020   Methamphetamine use disorder, severe (HCC) 06/17/2020   Opioid dependence, continuous (HCC) 08/08/2019   Alcohol dependence, uncomplicated (HCC) 05/15/2018   Alcohol use disorder, severe, dependence (HCC) 05/10/2018   Anxiety 05/10/2018   Benign prostatic hyperplasia with weak urinary stream 05/10/2018   Chronic pain syndrome 05/10/2018   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

## 2021-06-14 NOTE — ED Notes (Signed)
Belongings in purple. Bags wont fit in locker so everything is kept together next to lockers

## 2021-06-14 NOTE — ED Provider Notes (Signed)
Emergency Medicine Provider Triage Evaluation Note  Andrew Wheeler , a 55 y.o. male  was evaluated in triage.  Pt presents to ED for psychiatric evaluation and detox. Last drink this morning. Last used cocaine and heroin yesterday. Hx of depression and anxiety, worsening over past 7 months.   Review of Systems  Positive: Auditory hallucinations, paranoia Negative: SI, HI, visual hallucinations  Physical Exam  BP 110/77 (BP Location: Left Arm)   Pulse 73   Temp 97.8 F (36.6 C) (Oral)   Resp 16   SpO2 98%  Gen:   Awake, no distress   Resp:  Normal effort  MSK:   Moves extremities without difficulty  Other:    Medical Decision Making  Medically screening exam initiated at 12:56 PM.  Appropriate orders placed.  Raiford Noble was informed that the remainder of the evaluation will be completed by another provider, this initial triage assessment does not replace that evaluation, and the importance of remaining in the ED until their evaluation is complete.     Jeanella Flattery 06/14/21 1258    Terald Sleeper, MD 06/14/21 1331

## 2021-06-14 NOTE — ED Notes (Signed)
Pt wanded and cleared by security. Changed into burgundy scrubs per policy. Belongings taken to purple zone and valuables to security. No meds from home.
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
## 2021-06-14 NOTE — Progress Notes (Signed)
Substance abuse resources attached to AVS. Patient is being assessed by psychiatry.

## 2021-06-14 NOTE — ED Triage Notes (Signed)
Pt reports "needing a mental health evaluation." Pt denies SI/HI. Pt does reports hearing voices but denies visual hallucinations. Pt reports last drink this morning.

## 2021-06-15 ENCOUNTER — Other Ambulatory Visit: Payer: Self-pay

## 2021-06-15 ENCOUNTER — Ambulatory Visit (INDEPENDENT_AMBULATORY_CARE_PROVIDER_SITE_OTHER)
Admission: EM | Admit: 2021-06-15 | Discharge: 2021-06-16 | Disposition: A | Discharge status: Other Acute Inpatient Hospital | Payer: Medicare HMO | Source: Home / Self Care

## 2021-06-15 ENCOUNTER — Encounter (HOSPITAL_COMMUNITY): Payer: Self-pay | Admitting: Registered Nurse

## 2021-06-15 DIAGNOSIS — F1020 Alcohol dependence, uncomplicated: Secondary | ICD-10-CM

## 2021-06-15 DIAGNOSIS — F1910 Other psychoactive substance abuse, uncomplicated: Secondary | ICD-10-CM | POA: Diagnosis present

## 2021-06-15 DIAGNOSIS — F1024 Alcohol dependence with alcohol-induced mood disorder: Secondary | ICD-10-CM | POA: Insufficient documentation

## 2021-06-15 DIAGNOSIS — F1994 Other psychoactive substance use, unspecified with psychoactive substance-induced mood disorder: Secondary | ICD-10-CM | POA: Insufficient documentation

## 2021-06-15 DIAGNOSIS — F411 Generalized anxiety disorder: Secondary | ICD-10-CM | POA: Diagnosis present

## 2021-06-15 DIAGNOSIS — Z5900 Homelessness unspecified: Secondary | ICD-10-CM | POA: Insufficient documentation

## 2021-06-15 DIAGNOSIS — N401 Enlarged prostate with lower urinary tract symptoms: Secondary | ICD-10-CM

## 2021-06-15 DIAGNOSIS — F17210 Nicotine dependence, cigarettes, uncomplicated: Secondary | ICD-10-CM | POA: Insufficient documentation

## 2021-06-15 DIAGNOSIS — F19959 Other psychoactive substance use, unspecified with psychoactive substance-induced psychotic disorder, unspecified: Secondary | ICD-10-CM | POA: Diagnosis present

## 2021-06-15 DIAGNOSIS — R3912 Poor urinary stream: Secondary | ICD-10-CM

## 2021-06-15 DIAGNOSIS — F1220 Cannabis dependence, uncomplicated: Secondary | ICD-10-CM | POA: Diagnosis not present

## 2021-06-15 DIAGNOSIS — R440 Auditory hallucinations: Secondary | ICD-10-CM | POA: Insufficient documentation

## 2021-06-15 DIAGNOSIS — F419 Anxiety disorder, unspecified: Secondary | ICD-10-CM | POA: Diagnosis not present

## 2021-06-15 MED ORDER — HYDROXYZINE HCL 25 MG PO TABS
25.0000 mg | ORAL_TABLET | Freq: Three times a day (TID) | ORAL | Status: DC | PRN
  Administered 2021-06-16 (×2): 25 mg via ORAL
  Filled 2021-06-15 (×2): qty 1

## 2021-06-15 MED ORDER — LORAZEPAM 1 MG PO TABS: 1.0000 mg | ORAL_TABLET | Freq: Four times a day (QID) | ORAL | Status: DC | PRN

## 2021-06-15 MED ORDER — ACETAMINOPHEN 325 MG PO TABS
650.0000 mg | ORAL_TABLET | Freq: Four times a day (QID) | ORAL | Status: DC | PRN
  Administered 2021-06-16: 650 mg via ORAL
  Filled 2021-06-15: qty 2

## 2021-06-15 MED ORDER — ONDANSETRON 4 MG PO TBDP: 4.0000 mg | ORAL_TABLET | Freq: Four times a day (QID) | ORAL | Status: DC | PRN

## 2021-06-15 MED ORDER — GABAPENTIN 100 MG PO CAPS
200.0000 mg | ORAL_CAPSULE | Freq: Three times a day (TID) | ORAL | Status: DC
  Administered 2021-06-15 – 2021-06-16 (×4): 200 mg via ORAL
  Filled 2021-06-15 (×4): qty 2

## 2021-06-15 MED ORDER — DULOXETINE HCL 20 MG PO CPEP
20.0000 mg | ORAL_CAPSULE | Freq: Once | ORAL | Status: AC
  Administered 2021-06-15: 20 mg via ORAL
  Filled 2021-06-15: qty 1

## 2021-06-15 MED ORDER — THIAMINE HCL 100 MG PO TABS
100.0000 mg | ORAL_TABLET | Freq: Every day | ORAL | Status: DC
  Administered 2021-06-16: 100 mg via ORAL
  Filled 2021-06-15: qty 1

## 2021-06-15 MED ORDER — ADULT MULTIVITAMIN W/MINERALS CH
1.0000 | ORAL_TABLET | Freq: Every day | ORAL | Status: DC
  Administered 2021-06-15 – 2021-06-16 (×2): 1 via ORAL
  Filled 2021-06-15 (×2): qty 1

## 2021-06-15 MED ORDER — LOPERAMIDE HCL 2 MG PO CAPS: 2.0000 mg | ORAL_CAPSULE | ORAL | Status: DC | PRN

## 2021-06-15 MED ORDER — THIAMINE HCL 100 MG/ML IJ SOLN
100.0000 mg | Freq: Once | INTRAMUSCULAR | Status: AC
  Administered 2021-06-15: 100 mg via INTRAMUSCULAR
  Filled 2021-06-15: qty 2

## 2021-06-15 MED ORDER — OLANZAPINE 5 MG PO TABS
5.0000 mg | ORAL_TABLET | Freq: Every day | ORAL | Status: DC
  Administered 2021-06-15: 5 mg via ORAL
  Filled 2021-06-15 (×2): qty 1

## 2021-06-15 MED ORDER — OLANZAPINE 5 MG PO TABS
5.0000 mg | ORAL_TABLET | Freq: Every day | ORAL | Status: DC
  Administered 2021-06-15 – 2021-06-16 (×2): 5 mg via ORAL
  Filled 2021-06-15 (×2): qty 1
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
## 2021-06-15 NOTE — Progress Notes (Signed)
Pt is a transfer from [facility]. Pt is admitted to continuous observation due to alcohol abuse and AH. Pt is alert and oriented. Pt is ambulatory and is oriented to staff/unit. Pt was cooperative with skin assessment. Rash was noted on pt's abdomen. Pt reported left hip surgery 8 weeks ago. Pt was educated on falls. Pt denies current SI/HI/VH. Nourishment given. Staff will monitor for pt's safety.

## 2021-06-15 NOTE — Progress Notes (Signed)
Pt's CIWA was 6. °

## 2021-06-15 NOTE — ED Notes (Signed)
Pt is in 36 talking to TTS

## 2021-06-15 NOTE — ED Notes (Signed)
Placed Breakfast order 

## 2021-06-15 NOTE — ED Notes (Signed)
Pt is sleeping in NAD. No concerns noted or expressed since arrival to this assignment.

## 2021-06-16 NOTE — ED Notes (Signed)
Pt discharged with  AVS.  AVS reviewed prior to discharge.  Pt alert, oriented, and ambulatory.  Safety maintained. All belongings returned at discharge.
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
## 2021-06-16 NOTE — Progress Notes (Signed)
Patient given [place] sandwich, pretzels, milk and 2 OJ per his request.

## 2021-06-16 NOTE — ED Notes (Signed)
Pt yelling and upset because he cannot get meds

## 2021-06-16 NOTE — Progress Notes (Signed)
Pt was yelling and cussing at staff demanding to be given Ativan. Pt was informed again that he did not meet criteria for administration. Pt then started using profanities and stated that he is going to sue staff for depriving him of his medicine. PRN Vistaril 25mg  administered. Pt requested to be discharged. , NP notified via secure chat. Will continue to monitor.

## 2021-06-16 NOTE — Progress Notes (Signed)
Pt's CIWA was 7. 

## 2021-06-16 NOTE — Progress Notes (Signed)
Pt is awake, alert and oriented. Pt was agitated when he was informed that he cannot get Ativan due to low CIWA score 5 this AM. Pt became irate when this writer informed him that the next CIWA assessment is at 12pm. Pt initially refused to take his scheduled morning meds. Administered meds with no further incident. No signs of acute distress noted. Breakfast given. Staff will monitor for pt's safety.

## 2021-06-16 NOTE — ED Notes (Signed)
Pt medicated with vistaril 25 mg po d/t report of moderate anxiety

## 2021-06-16 NOTE — Discharge Instructions (Signed)
Patient is instructed prior to discharge to: Take all medications as prescribed by his/her mental healthcare provider. Report any adverse effects and or reactions from the medicines to his/her outpatient provider promptly. Patient has been instructed & cautioned: To not engage in alcohol and or illegal drug use while on prescription medicines. In the event of worsening symptoms, patient is instructed to call the crisis hotline, 911 and or go to the nearest ED for appropriate evaluation and treatment of symptoms. To follow-up with his/her primary care provider for your other medical issues, concerns and or health care needs.   Patient is instructed prior to discharge to: Take all medications as prescribed by his/her mental healthcare provider. Report any adverse effects and or reactions from the medicines to his/her outpatient provider promptly. Patient has been instructed & cautioned: To not engage in alcohol and or illegal drug use while on prescription medicines. In the event of worsening symptoms, patient is instructed to call the crisis hotline, 911 and or go to the nearest ED for appropriate evaluation and treatment of symptoms. To follow-up with his/her primary care provider for your other medical issues, concerns and or health care needs.

## 2021-06-22 ENCOUNTER — Emergency Department (HOSPITAL_COMMUNITY)
Admission: EM | Admit: 2021-06-22 | Discharge: 2021-06-22 | Disposition: A | Discharge status: Home / Self Care | Payer: Medicare HMO | Attending: Emergency Medicine | Admitting: Emergency Medicine

## 2021-06-22 ENCOUNTER — Encounter (HOSPITAL_COMMUNITY): Payer: Self-pay | Admitting: Emergency Medicine

## 2021-06-22 ENCOUNTER — Other Ambulatory Visit: Payer: Self-pay

## 2021-06-22 DIAGNOSIS — Z7951 Long term (current) use of inhaled steroids: Secondary | ICD-10-CM | POA: Diagnosis not present

## 2021-06-22 DIAGNOSIS — T401X1A Poisoning by heroin, accidental (unintentional), initial encounter: Secondary | ICD-10-CM | POA: Insufficient documentation

## 2021-06-22 DIAGNOSIS — J449 Chronic obstructive pulmonary disease, unspecified: Secondary | ICD-10-CM | POA: Insufficient documentation

## 2021-06-22 DIAGNOSIS — F17210 Nicotine dependence, cigarettes, uncomplicated: Secondary | ICD-10-CM | POA: Insufficient documentation

## 2021-06-22 DIAGNOSIS — I10 Essential (primary) hypertension: Secondary | ICD-10-CM | POA: Diagnosis not present

## 2021-06-22 NOTE — ED Triage Notes (Addendum)
Per EMS, pt coming from halfway house for heroin overdose. EMS reports agonal respirations and unresponsiveness upon arrival. EMS administered 0.5 mg narcan. Pt now a&o x 4, respiration normal, airway patent. PT DENIES SI/HI.
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
## 2021-07-26 DEATH — deceased

## 2023-03-10 IMAGING — RF DG ESOPHAGUS
5 series · 19 of 20 positions shown · non-contrast
Comparison: Swallow function study performed same day.

CLINICAL DATA: GERD, pill stuck on swallow function that was
performed today.

EXAM:
ESOPHOGRAM/BARIUM SWALLOW
TECHNIQUE: Single contrast examination was performed using  thin barium.
FLUOROSCOPY TIME:  Fluoroscopy Time:  42 seconds
Radiation Exposure Index (if provided by the fluoroscopic device):
5.1 mGy
Number of Acquired Spot Images: 0

[Series 1: cp_standard · 0.37mm/px · 4 of 45 frames shown (1 of 5)]
[frame 1/45]
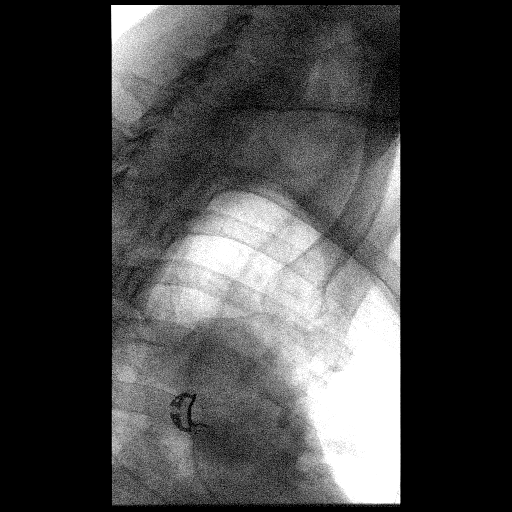
[frame 7/45]
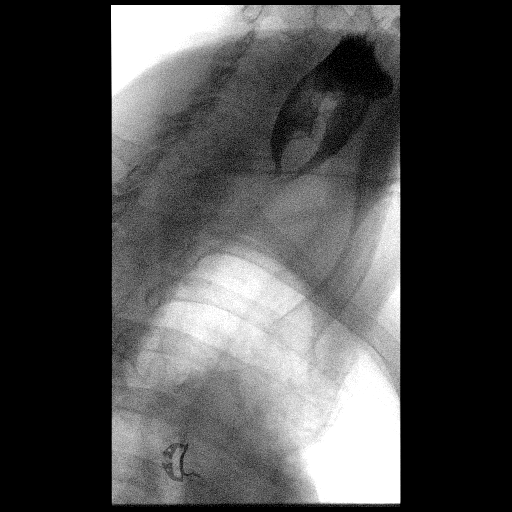
[frame 23/45]
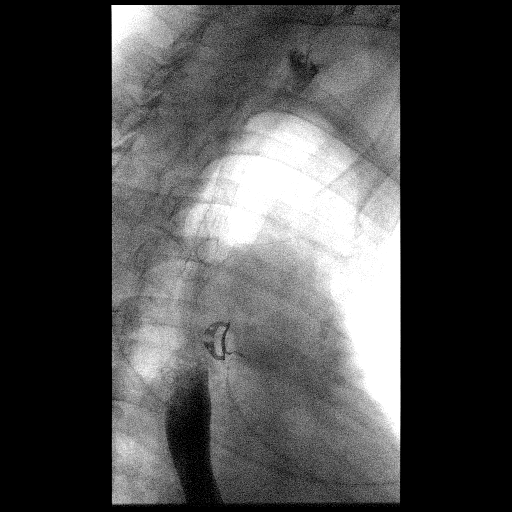
[frame 39/45]
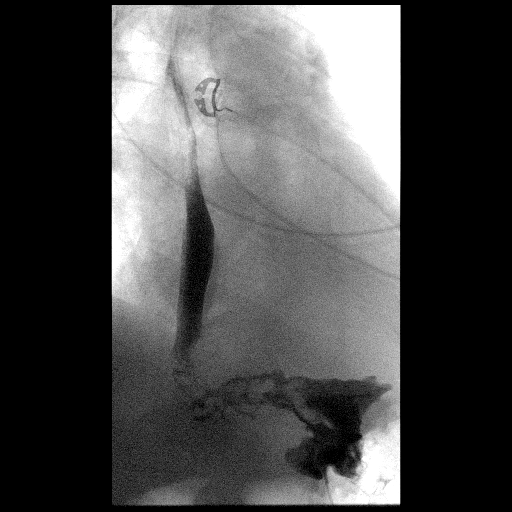

[Series 2: cp_standard · 0.37mm/px · 4 of 46 frames shown (2 of 5)]
[frame 7/46]
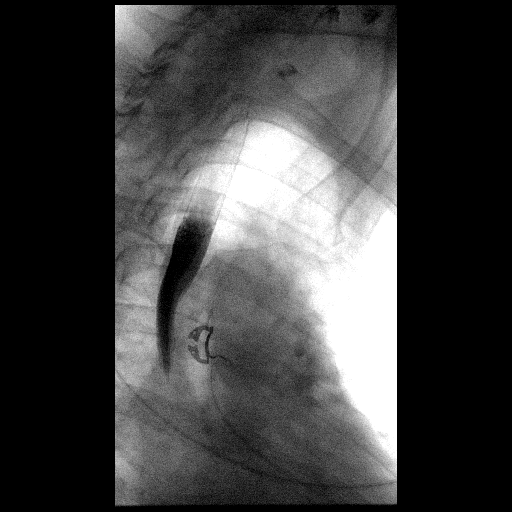
[frame 17/46]
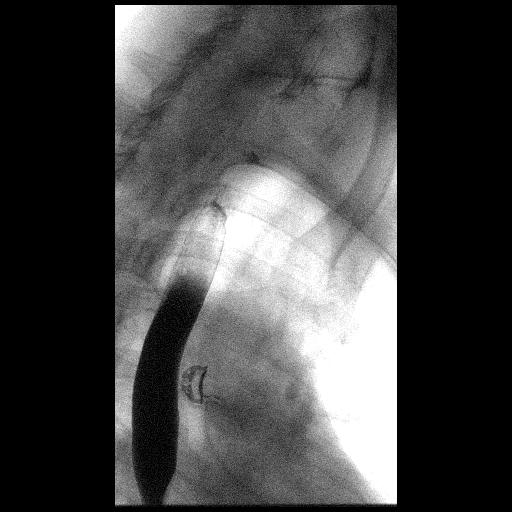
[frame 24/46]
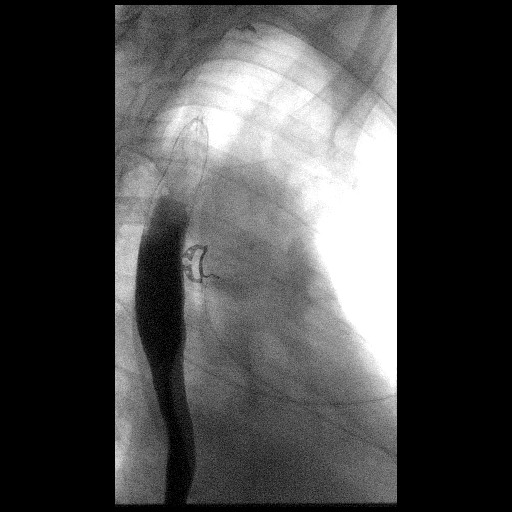
[frame 40/46]
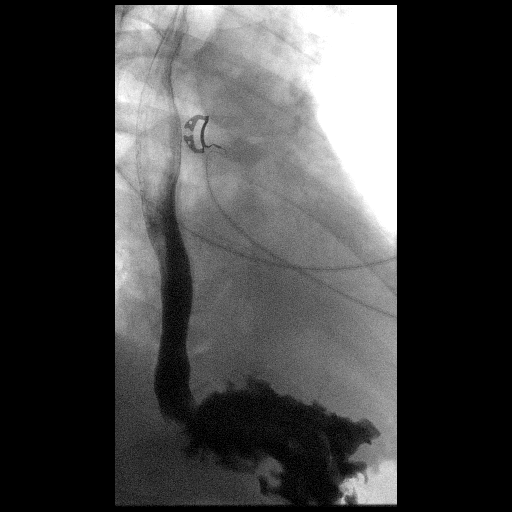

[Series 3: cp_standard · 0.37mm/px · 3 of 43 frames shown (3 of 5)]
[frame 5/43]
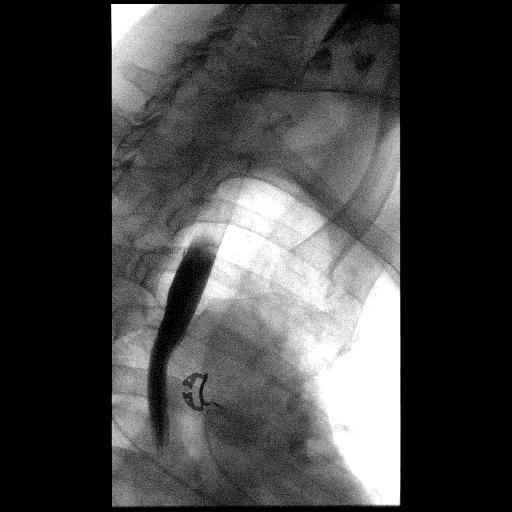
[frame 22/43]
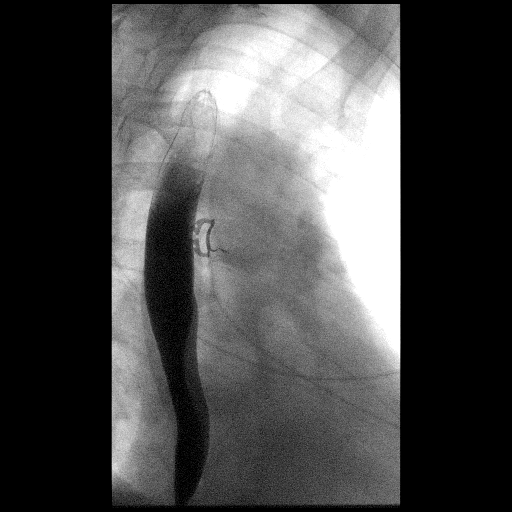
[frame 37/43]
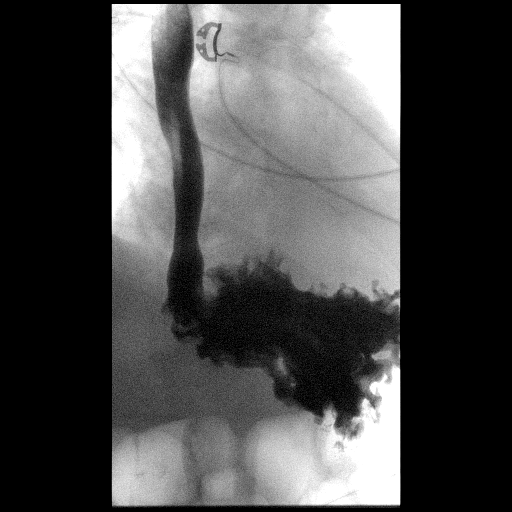

[Series 4: cp_standard · 0.37mm/px · 4 of 32 frames shown (4 of 5)]
[frame 1/32]
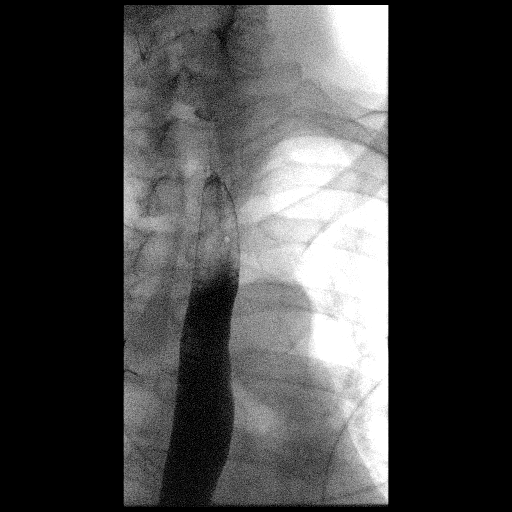
[frame 5/32]
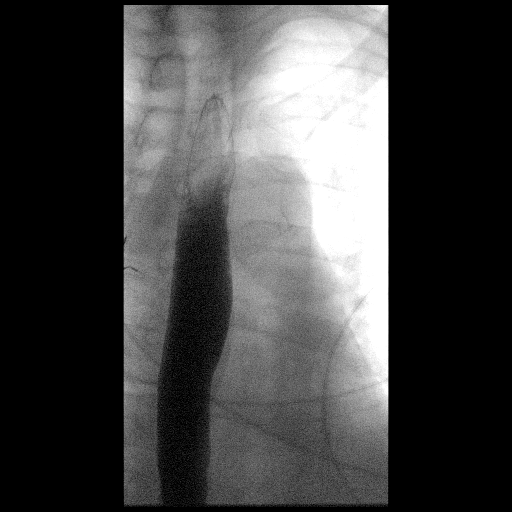
[frame 17/32]
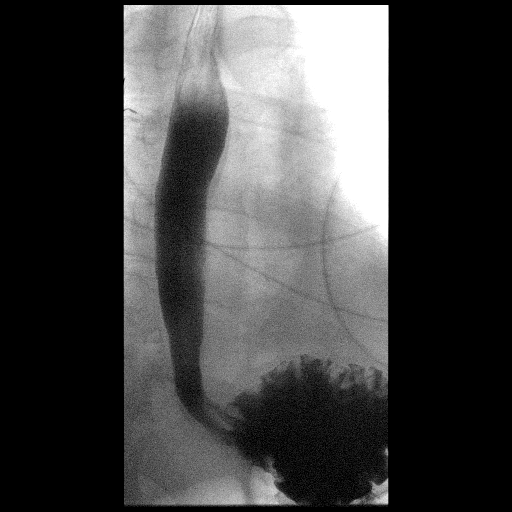
[frame 28/32]
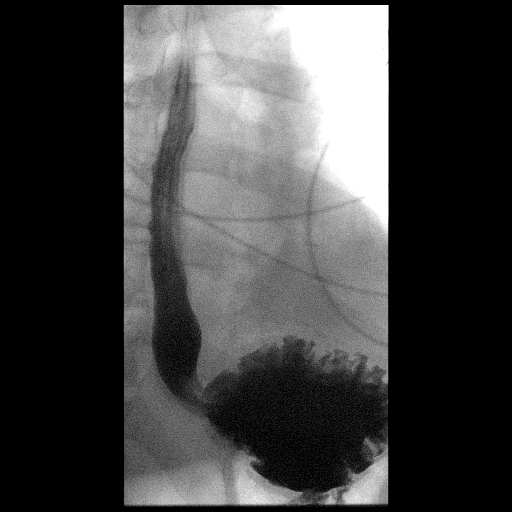

[Series 5: cp_standard · 0.37mm/px · 4 of 60 frames shown (5 of 5)]
[frame 9/60]
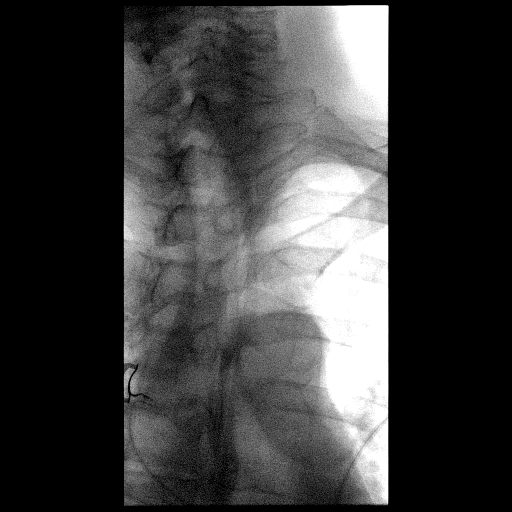
[frame 10/60]
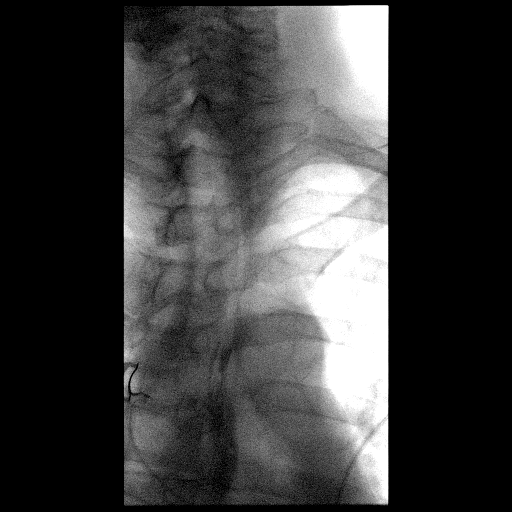
[frame 31/60]
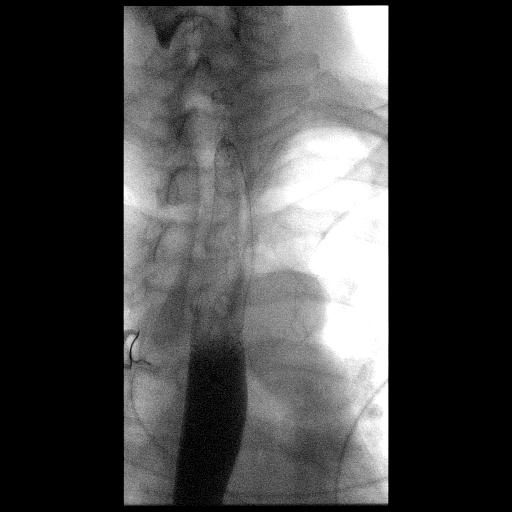
[frame 52/60]
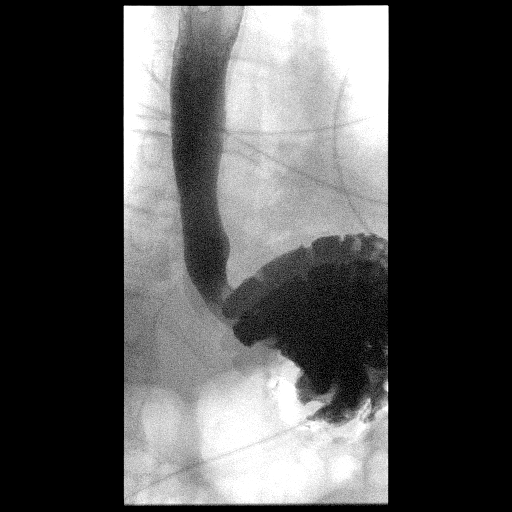

[19 of 20 positions shown; findings below may reference images not displayed]

FINDINGS: Swallowing was performed with thin barium showing no gross swallow
dysfunction. Esophagus without signs of stricture grossly. Limited
assessment as the patient could only be position in the LPO and AP
projection due to injuries. No gross hiatal hernia. Motility not
well assessed due to patient positioning and limitations described
above.
IMPRESSION: Limited evaluation without signs of gross dysmotility, esophageal
stricture or gross hiatal hernia.

If there are symptoms that would suggest stricture such as globus
sensation repeat evaluation could be performed when the patient is
better able to stand and roll to RIGHT-side and or lay in the prone
position for full esophageal assessment.
